# Patient Record
Sex: Male | Born: 1945 | Race: White | Hispanic: No | Marital: Married | Smoking: Never smoker
Health system: Southern US, Community
[De-identification: ages and names within clinical notes are randomized; demographics above are authoritative.]

## PROBLEM LIST (undated history)

## (undated) DIAGNOSIS — Z789 Other specified health status: Secondary | ICD-10-CM

## (undated) HISTORY — PX: SHOULDER ARTHROSCOPY: SHX128

---

## 2008-06-19 ENCOUNTER — Ambulatory Visit (HOSPITAL_BASED_OUTPATIENT_CLINIC_OR_DEPARTMENT_OTHER): Admission: RE | Admit: 2008-06-19 | Discharge: 2008-06-19 | Payer: Self-pay | Admitting: Orthopedic Surgery

## 2010-12-01 NOTE — Op Note (Signed)
NAME:  Ian Gilmore, Ian Gilmore              ACCOUNT NO.:  [id number]   MEDICAL RECORD NO.:  [id number]          PATIENT TYPE:  AMB   LOCATION:  DSC                          FACILITY:  [facility]   PHYSICIAN:  Feliberto Gottron. Turner Daniels, M.D.   DATE OF BIRTH:  [birth year]   DATE OF PROCEDURE:  06/19/2008  DATE OF DISCHARGE:                               OPERATIVE REPORT   PREOPERATIVE DIAGNOSES:  Right shoulder impingement syndrome,  acromioclavicular joint arthritis, and possible rotator cuff tear.   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   PROCEDURE:  Right shoulder arthroscopic anterior-inferior acromioplasty;  formal distal clavicle excision; debridement of chondromalacia from the  humeral head, grade 3 to grade 4 over a 2.5 cm area; and debridement of  degenerative tearing of the superior-anterior labrum.   SURGEON:  Feliberto Gottron. Turner Daniels, MD   FIRST ASSISTANT:  Shirl Harris, PA-C   ANESTHESIA:  General endotracheal plus right interscalene block.   ESTIMATED BLOOD LOSS:  Minimal.   FLUID REPLACEMENT:  1200 mL of crystalloid.   DRAINS PLACED:  None.   TOURNIQUET TIME:  None.   INDICATIONS FOR PROCEDURE:  A 65 year old man with fairly impressive  subacromial spurring, AC joint arthritis with some gas in the [facility] joint  as well as symptoms consistent with impingement, possible labral tear  with some catching and popping in the shoulder.  Another consideration  would be a rotator cuff tear.  In any event, he has failed conservative  treatment, anti-inflammatory medicines, exercises, physical therapy, and  only gets temporary relief with cortisone injections.  He desires  elective arthroscopic evaluation and treatment of his shoulder.  The  risks and benefits of surgery discussed, questions answered.   DESCRIPTION OF PROCEDURE:  The patient was  identified by armband and  underwent right interscalene block anesthetic at [facility].  He was then taken to operating room 6, appropriate anesthetic  monitors were attached and general endotracheal anesthesia induced with  the patient in supine position.  He received preoperative IV  antibiotics.  He was then placed in the beach chair position, and the  right upper extremity prepped and draped in the usual sterile fashion  from the wrist to the hemithorax.  A time-out procedure was then  performed and using a #11 blade, standard portals were made at 1.5 cm  anterior to the [facility] joint, lateral to the junction, middle and posterior  surface of the acromion, and posterior to the posterolateral corner of  the acromion process.  Gravity inflow was placed anteriorly, the  arthroscope laterally, and a 4.2 great white sucker shaver posteriorly  allowing subacromial bursectomy and identification of a fairly large,  type 2 to type 3 subacromial spur of about 14-mm in depth, and this was  then removed with a 4.5-hooded vortex bur taking pretty much 3 full  thickness sweeps with the bur.  This revealed the arthritic AC joint  which was denuded of cartilage and the inferior distal 1 cm  of the  clavicle was then removed with the same bur.  This gave [place] good  visualization of the external leaflets of the rotator cuff, which were  intact.  We then brought the inflow in laterally, the arthroscope  posteriorly, and the bur in anteriorly, completing the distal clavicle  excision and photographic documentation was made of this.  The  arthroscope was then reinserted into the glenohumeral joint using the  posterior portal, where we documented some tearing of the superior-  anterior labrum, this was debrided back to a stable margin of 3.5 Gator  sucker shaver and more importantly, we found large flap tears off of the  central area of the humeral head that were debrided back to a stable  margin  with a 3.5 Gator sucker shaver and revealing grade 3 to grade 4  chondromalacia over a 2.5 cm diameter area of the humeral head.  We  evaluated the inferior patches to make sure there were no loose bodies  and at this point, the shoulder irrigated out with normal saline  solution.  The arthroscopic instruments were removed and a dressing of  Xeroform, 4x4 dressing, sponges, paper tape, and a sling applied.  The  patient laid supine, awakened, and taken to the recovery room without  difficulty.      Feliberto Gottron. Turner Daniels, M.D.  Electronically Signed     FJR/MEDQ  D:  06/19/2008  T:  06/20/2008  Job:  [id number]

## 2011-04-23 LAB — POCT HEMOGLOBIN-HEMACUE: Hemoglobin: 14.4 g/dL (ref 13.0–17.0)

## 2013-05-14 ENCOUNTER — Ambulatory Visit
Admission: RE | Admit: 2013-05-14 | Discharge: 2013-05-14 | Disposition: A | Discharge status: Home / Self Care | Payer: Medicare HMO | Source: Ambulatory Visit | Attending: Internal Medicine | Admitting: Internal Medicine

## 2013-05-14 ENCOUNTER — Other Ambulatory Visit: Payer: Self-pay | Admitting: Internal Medicine

## 2013-05-14 DIAGNOSIS — M545 Low back pain, unspecified: Secondary | ICD-10-CM

## 2015-06-16 ENCOUNTER — Ambulatory Visit: Payer: PPO | Attending: Geriatric Medicine | Admitting: Physical Therapy

## 2015-06-16 DIAGNOSIS — Y9302 Activity, running: Secondary | ICD-10-CM | POA: Diagnosis not present

## 2015-06-16 DIAGNOSIS — Y9301 Activity, walking, marching and hiking: Secondary | ICD-10-CM | POA: Insufficient documentation

## 2015-06-16 DIAGNOSIS — M546 Pain in thoracic spine: Secondary | ICD-10-CM | POA: Diagnosis not present

## 2015-06-16 DIAGNOSIS — M2560 Stiffness of unspecified joint, not elsewhere classified: Secondary | ICD-10-CM | POA: Diagnosis present

## 2015-06-16 DIAGNOSIS — X58XXXA Exposure to other specified factors, initial encounter: Secondary | ICD-10-CM | POA: Diagnosis not present

## 2015-06-16 NOTE — Therapy (Signed)
[place], [place], [address] Phone: [phone]   Fax:  [phone]  Physical Therapy Evaluation  Patient Details  Name: Ian Gilmore MRN: [id number] Date of Birth: 07/26/1945 Referring Provider: Felipa Eth  Encounter Date: 06/16/2015      PT End of Session - 06/16/15 1340    Visit Number 1   Number of Visits 12   Date for PT Re-Evaluation 07/28/15   PT Start Time 1021   PT Stop Time 1103   PT Time Calculation (min) 42 min   Activity Tolerance Patient tolerated treatment well      No past medical history on file.  No past surgical history on file.  There were no vitals filed for this visit.  Visit Diagnosis:  Midline thoracic back pain  Joint stiffness of spine  Activities involving walking and running      Subjective Assessment - 06/16/15 1022    Subjective Pt reports onset back in Fall 2014 when he was doing leg press at the gym.  Since then he has had fairly steady back pain and problems, is usually able to recover and return to the gym.  He has not been able to do so since 02/17/15.  He reports workouts 5-6 days a week for years up until the past year. . Runs, weights, core work.  He denies numbness, weakness or red flags.  Mechanical low back pain but now pain is mid back.  Can do all he needs to do but can feel pain and tightness post activity.      Pertinent History Occ mechanical LBP over the years, revealed towards end of eval it has radiated into hi Rt. leg in the past.  Strained L ant rib recently when lifting.     Limitations Other (comment);Lifting;House hold activities;Walking  Exercise, yardwork    How long can you sit comfortably? Not limited   How long can you stand comfortably? Not limited   How long can you walk comfortably? Gets uncomfortable after "a while" but unable to time.    Diagnostic tests no new XR, in 2014 he had XR which was neg but did have min anterolisthesis  in L spine    Patient Stated Goals return to the gym and lose weight   Currently in Pain? Yes   Pain Score 1   with quick movements can be sudden and severe but resolves   Pain Location Back   Pain Orientation Mid   Pain Descriptors / Indicators Sharp   Pain Type Chronic pain   Pain Onset More than a month ago   Pain Frequency Intermittent   Aggravating Factors  trunk rotation, quick movements laterally and rotating   Pain Relieving Factors bilateral knees to chest sometimes help, uses heat alot in recliner    Effect of Pain on Daily Activities has to be cautious with more strenuous activities.             [facility] PT Assessment - 06/16/15 1037    Assessment   Medical Diagnosis back pain    Referring Provider [place]   Onset Date/Surgical Date 02/17/15  chronic   Prior Therapy no   Precautions   Precautions None   Restrictions   Weight Bearing Restrictions No   Balance Screen   Has the patient fallen in the past 6 months No   [place] residence   Prior Function   Level of Independence Independent   Cognition  Overall Cognitive Status Within Functional Limits for tasks assessed   Observation/Other Assessments   Focus on Therapeutic Outcomes (FOTO)  43%   Sensation   Light Touch Appears Intact   Coordination   Gross Motor Movements are Fluid and Coordinated Not tested   Posture/Postural Control   Posture/Postural Control No significant limitations   AROM   Right/Left Shoulder --  WNL but pain mid back end range overhead   Cervical Flexion WNL stretch   Cervical - Right Side Bend WNL min pain   Cervical - Left Side Bend WNL min pain    Cervical - Right Rotation WNL pain    Cervical - Left Rotation WNL pain    Lumbar Flexion WNL    Lumbar Extension WNL min pain    Lumbar - Right Rotation pain and 25% limited    Lumbar - Left Rotation min pain and 10-15% limited    Strength   Strength Assessment Site --  UE strength in  flexion and abd WNL    Palpation   Spinal mobility hypomobile in Thoracic spine, pain T7-T10   Palpation comment mid thoracic rotated L                    [facility] Adult PT Treatment/Exercise - 06/16/15 1037    Lumbar Exercises: Sidelying   Other Sidelying Lumbar Exercises upper trunk rotation (book openings) x 5 each side   Lumbar Exercises: Quadruped   Madcat/Old Horse 5 reps   Other Quadruped Lumbar Exercises child's pose    Manual Therapy   Manual Therapy Joint mobilization;Soft tissue mobilization;Myofascial release   Joint Mobilization T7-T10 Unilateral Gr. 1-2 on L side   Soft tissue mobilization paraspinals and lower trap L    Myofascial Release mid back                 PT Education - 06/16/15 1339    Education provided Yes   Education Details differential diagnosis, HEP, PT/POC   Person(s) Educated Patient   Methods Explanation;Demonstration;Verbal cues;Handout   Comprehension Verbalized understanding;Returned demonstration          PT Short Term Goals - 06/16/15 1349    PT SHORT TERM GOAL #1   Title Pt will be able to to walk normal pace for 30 min and report no lasting increase in pain    Time 3   Period Weeks   Status New   PT SHORT TERM GOAL #2   Title Pt will be I with basic HEP   Time 3   Period Weeks   Status New   PT SHORT TERM GOAL #3   Title Pt will be able to perform overhead motions with UE and rotation with no pain in mid back.    Time 3   Period Weeks   Status New           PT Long Term Goals - 06/16/15 1351    PT LONG TERM GOAL #1   Title Pt will be I with more advanced HEP and use of proper lifting techniques to help get him back into the gym.    Time 6   Period Weeks   Status New   PT LONG TERM GOAL #2   Title Pt will score 40% limited or less on FOTO to demo improvement in function, mobility.    Time 6   Period Weeks   Status New   PT LONG TERM GOAL #3   Title Pt will return to modified lifting  and cardio  routine without lasting pain in back.    Time 6   Period Weeks   Status New   PT LONG TERM GOAL #4   Title Pt will be able to sleep with min disturbance of pain most days of the week   Time 6   Period Weeks   Status New               Plan - 07/04/15 1344    Clinical Impression Statement Patient with longstanding pain in mid back which radiates into bilateral ribs wrapping anteriorly.  This limits his ability to perform quicker movements or routine activities without pain.  He has not worked out due to fear of further injury since Aug and is eager to do so.  Symptoms are consistent with unilateral costovertebral rotation vs muscle strain.  Given hypomobility and patten of radiaion and persistent pain, I feel the spine is involved.     Pt will benefit from skilled therapeutic intervention in order to improve on the following deficits Pain;Hypomobility;Decreased mobility;Impaired flexibility;Increased fascial restricitons   Rehab Potential Excellent   PT Frequency 2x / week   PT Duration 6 weeks   PT Treatment/Interventions Ultrasound;Neuromuscular re-education;Passive range of motion;Electrical Stimulation;Cryotherapy;Functional mobility training;Therapeutic activities;Therapeutic exercise;Moist Heat;Manual techniques;Dry needling   PT Next Visit Plan Manual and modalities, check HEP and move to safe core (plank) consider MMT of LE   PT Home Exercise Plan quadruped and upper trunk rot.    Consulted and Agree with Plan of Care Patient          G-Codes - 07/04/2015 1315    Functional Assessment Tool Used FOTO   Functional Limitation Mobility: Walking and moving around   Mobility: Walking and Moving Around Current Status [phone]) At least 40 percent but less than 60 percent impaired, limited or restricted   Mobility: Walking and Moving Around Goal Status [phone]) At least 20 percent but less than 40 percent impaired, limited or restricted       Problem List There are no active  problems to display for this patient.   Nancyjo Givhan Jul 04, 2015, 1:57 PM  [place], [place], [address] Phone: [phone]   Fax:  [phone]  Name: Ian Gilmore MRN: [id number] Date of Birth: Dec 08, 1945   Raeford Razor, PT 07-04-15 1:57 PM Phone: [phone] Fax: [phone]

## 2015-06-16 NOTE — Patient Instructions (Signed)
  [email or web address]   Copyright  VHI. All rights reserved.

## 2015-06-30 ENCOUNTER — Ambulatory Visit: Payer: PPO | Attending: Geriatric Medicine | Admitting: Physical Therapy

## 2015-06-30 DIAGNOSIS — Y9301 Activity, walking, marching and hiking: Secondary | ICD-10-CM | POA: Diagnosis not present

## 2015-06-30 DIAGNOSIS — M2560 Stiffness of unspecified joint, not elsewhere classified: Secondary | ICD-10-CM

## 2015-06-30 DIAGNOSIS — Y9302 Activity, running: Secondary | ICD-10-CM | POA: Insufficient documentation

## 2015-06-30 DIAGNOSIS — M546 Pain in thoracic spine: Secondary | ICD-10-CM | POA: Diagnosis not present

## 2015-06-30 NOTE — Patient Instructions (Signed)
Gave bird Education officer, environmental, info on [employer] dry needling [employer] info, see self care

## 2015-06-30 NOTE — Therapy (Signed)
[place], [place], [address] Phone: [phone]   Fax:  [phone]  Physical Therapy Treatment  Patient Details  Name: Ian Gilmore MRN: [id number] Date of Birth: 05/25/1946 Referring Provider: Felipa Eth  Encounter Date: 06/30/2015      PT End of Session - 06/30/15 1511    Visit Number 2   Number of Visits 12   Date for PT Re-Evaluation 07/28/15   PT Start Time 4076   PT Stop Time 1525   PT Time Calculation (min) 70 min   Activity Tolerance Patient tolerated treatment well   Behavior During Therapy [facility] for tasks assessed/performed      No past medical history on file.  No past surgical history on file.  There were no vitals filed for this visit.  Visit Diagnosis:  Midline thoracic back pain  Joint stiffness of spine  Activities involving walking and running      Subjective Assessment - 06/30/15 1421    Subjective Pt with pain when he is more physical, discomfort in mid back.  Never goes away.    Currently in Pain? Yes   Pain Score 2    Pain Location Back   Pain Orientation Mid   Pain Descriptors / Indicators Tender;Sharp   Pain Type Chronic pain   Pain Onset More than a month ago   Pain Frequency Constant   Aggravating Factors  trunk rot, raking   Pain Relieving Factors stretching   Multiple Pain Sites No            [facility] PT Assessment - 06/30/15 1437    Strength   Right Hip Extension 5/5   Right Hip ABduction 4+/5   Left Hip Extension 5/5   Left Hip ABduction 4+/5   Palpation   Spinal mobility hypomobile in Thoracic spine, pain T7-T10   Palpation comment mid thoracic rotated L                      [facility] Adult PT Treatment/Exercise - 06/30/15 1434    Self-Care   Self-Care Posture;Other Self-Care Comments   Posture sitting posture, use of abdominals during weights   Other Self-Care Comments  safety: rec no running and "swinging the bat" until he has  done 4 consecutive visits.  Advised to stick with bike/walking and HEP, UE weights ok   Lumbar Exercises: Sidelying   Other Sidelying Lumbar Exercises upper trunk rotation (book openings) x 5 each side  more pain to L    Lumbar Exercises: Prone   Opposite Arm/Leg Raise Right arm/Left leg;Left arm/Right leg;10 reps   Lumbar Exercises: Quadruped   Madcat/Old Horse 10 reps   Plank modified in quadruped, able to do full elbow plank legs extended with min strain in low back    Other Quadruped Lumbar Exercises child's pose    Moist Heat Therapy   Number Minutes Moist Heat 15 Minutes   Moist Heat Location Other (comment)  thoracic   Manual Therapy   Manual Therapy Joint mobilization;Soft tissue mobilization;Myofascial release   Joint Mobilization T7-T10 Unilateral Gr. 1-2 on L side  P/A gr 1-2    Soft tissue mobilization paraspinals and lower trap L    Myofascial Release mid back                 PT Education - 06/30/15 1511    Education provided Yes   Education Details POC, core ex, safety, Dry needling    Person(s) Educated Patient  Methods Explanation;Demonstration;Handout   Comprehension Verbalized understanding;Returned demonstration          PT Short Term Goals - 06/16/15 1349    PT SHORT TERM GOAL #1   Title Pt will be able to to walk normal pace for 30 min and report no lasting increase in pain    Time 3   Period Weeks   Status New   PT SHORT TERM GOAL #2   Title Pt will be I with basic HEP   Time 3   Period Weeks   Status New   PT SHORT TERM GOAL #3   Title Pt will be able to perform overhead motions with UE and rotation with no pain in mid back.    Time 3   Period Weeks   Status New           PT Long Term Goals - 06/16/15 1351    PT LONG TERM GOAL #1   Title Pt will be I with more advanced HEP and use of proper lifting techniques to help get him back into the gym.    Time 6   Period Weeks   Status New   PT LONG TERM GOAL #2   Title Pt will  score 40% limited or less on FOTO to demo improvement in function, mobility.    Time 6   Period Weeks   Status New   PT LONG TERM GOAL #3   Title Pt will return to modified lifting and cardio routine without lasting pain in back.    Time 6   Period Weeks   Status New   PT LONG TERM GOAL #4   Title Pt will be able to sleep with min disturbance of pain most days of the week   Time 6   Period Weeks   Status New               Plan - 06/30/15 1512    Clinical Impression Statement 2nd visit, no goals met. Pt with multiple questions and concerns about returning to full function, how to improve physical fitness and not exacerbate pain.  No pain post session but did "feel something" in mid back as he walked out.    PT Next Visit Plan Manual and modalities, lower ab progression cont to answer questions re: technique and neutral spine   PT Home Exercise Plan quadruped and upper trunk rot. added bird dog   Consulted and Agree with Plan of Care Patient        Problem List There are no active problems to display for this patient.   PAA,JENNIFER 06/30/2015, 3:28 PM  [facility] [address] [place], [place], [address] Phone: [phone]   Fax:  [phone]  Name: HOLDAN STUCKE MRN: [id number] Date of Birth: [birth year]    Raeford Razor, PT 06/30/2015 3:29 PM Phone: [phone] Fax: [phone]

## 2015-07-03 ENCOUNTER — Ambulatory Visit: Payer: PPO | Admitting: Physical Therapy

## 2015-07-03 DIAGNOSIS — Y9302 Activity, running: Secondary | ICD-10-CM

## 2015-07-03 DIAGNOSIS — Y9301 Activity, walking, marching and hiking: Secondary | ICD-10-CM

## 2015-07-03 DIAGNOSIS — M546 Pain in thoracic spine: Secondary | ICD-10-CM | POA: Diagnosis not present

## 2015-07-03 DIAGNOSIS — M2560 Stiffness of unspecified joint, not elsewhere classified: Secondary | ICD-10-CM

## 2015-07-03 NOTE — Therapy (Signed)
[place] [place], [place], [address] Phone: [phone]   Fax:  [phone]  Physical Therapy Treatment  Patient Details  Name: Ian Gilmore MRN: [id number] Date of Birth: 12-Feb-1946 Referring Provider: Felipa Eth  Encounter Date: 07/03/2015      PT End of Session - 07/03/15 1548    Visit Number 3   Number of Visits 12   Date for PT Re-Evaluation 07/28/15   PT Start Time 1500   PT Stop Time 1553   PT Time Calculation (min) 53 min   Activity Tolerance Patient tolerated treatment well   Behavior During Therapy [facility] for tasks assessed/performed      No past medical history on file.  No past surgical history on file.  There were no vitals filed for this visit.  Visit Diagnosis:  Midline thoracic back pain  Joint stiffness of spine  Activities involving walking and running      Subjective Assessment - 07/03/15 1501    Subjective No pain now.  Raking a little yesterday.  It was horrible this AM.     Currently in Pain? No/denies             [facility] Adult PT Treatment/Exercise - 07/03/15 1511    Lumbar Exercises: Stretches   Standing Side Bend 2 reps;30 seconds   Standing Side Bend Limitations seated over bolster for thoracic    Lumbar Exercises: Standing   Other Standing Lumbar Exercises isometric post chain strengthening   Other Standing Lumbar Exercises corner stretch    Lumbar Exercises: Supine   Ab Set 10 reps   Clam 10 reps;Other (comment)   Clam Limitations 3 sets used props and mod cueing   Bent Knee Raise 20 reps   Bent Knee Raise Limitations ball under pelvis   Bridge 10 reps   Straight Leg Raise 10 reps   Straight Leg Raises Limitations ball   added opp LE for core challenge    Other Supine Lumbar Exercises TrA x 10 with  ball squeeze   Other Supine Lumbar Exercises lower ab progression from table top with modifcations         MHP 15 min prone             PT Short  Term Goals - 06/16/15 1349    PT SHORT TERM GOAL #1   Title Pt will be able to to walk normal pace for 30 min and report no lasting increase in pain    Time 3   Period Weeks   Status New   PT SHORT TERM GOAL #2   Title Pt will be I with basic HEP   Time 3   Period Weeks   Status New   PT SHORT TERM GOAL #3   Title Pt will be able to perform overhead motions with UE and rotation with no pain in mid back.    Time 3   Period Weeks   Status New           PT Long Term Goals - 06/16/15 1351    PT LONG TERM GOAL #1   Title Pt will be I with more advanced HEP and use of proper lifting techniques to help get him back into the gym.    Time 6   Period Weeks   Status New   PT LONG TERM GOAL #2   Title Pt will score 40% limited or less on FOTO to demo improvement in function, mobility.    Time  6   Period Weeks   Status New   PT LONG TERM GOAL #3   Title Pt will return to modified lifting and cardio routine without lasting pain in back.    Time 6   Period Weeks   Status New   PT LONG TERM GOAL #4   Title Pt will be able to sleep with min disturbance of pain most days of the week   Time 6   Period Weeks   Status New               Plan - 07/03/15 1632    Clinical Impression Statement Worked on low ab progression and posture in standing. Understands relation of shoulder tightness to postural imbalance and back pain.  Pt needs cues to move slowly and safely to decrease risk of reinjury.    PT Next Visit Plan Manual and modalities, lower ab progression cont to answer questions re: technique and neutral spine   PT Home Exercise Plan quadruped and upper trunk rot. added bird dog   Consulted and Agree with Plan of Care Patient        Problem List There are no active problems to display for this patient.   PAA,JENNIFER 07/03/2015, 4:34 PM  [place], [place], [address] Phone: [phone]   Fax:   [phone]  Name: GORGE KEHR MRN: [id number] Date of Birth: 1946/05/05    Raeford Razor, PT 07/03/2015 4:34 PM Phone: [phone] Fax: [phone]

## 2015-07-07 ENCOUNTER — Ambulatory Visit: Payer: PPO | Admitting: Physical Therapy

## 2015-07-07 DIAGNOSIS — M546 Pain in thoracic spine: Secondary | ICD-10-CM

## 2015-07-07 DIAGNOSIS — Y9302 Activity, running: Secondary | ICD-10-CM

## 2015-07-07 DIAGNOSIS — M2560 Stiffness of unspecified joint, not elsewhere classified: Secondary | ICD-10-CM

## 2015-07-07 DIAGNOSIS — Y9301 Activity, walking, marching and hiking: Secondary | ICD-10-CM

## 2015-07-07 NOTE — Therapy (Signed)
[place], [place], [address] Phone: [phone]   Fax:  [phone]  Physical Therapy Treatment  Patient Details  Name: Ian Gilmore MRN: [id number] Date of Birth: [birth year] Referring Provider: Felipa Eth  Encounter Date: 07/07/2015      PT End of Session - 07/07/15 1025    Visit Number 4   Number of Visits 12   Date for PT Re-Evaluation 07/28/15   PT Start Time 1022   PT Stop Time 1115   PT Time Calculation (min) 53 min   Activity Tolerance Patient tolerated treatment well   Behavior During Therapy [facility] for tasks assessed/performed      No past medical history on file.  No past surgical history on file.  There were no vitals filed for this visit.  Visit Diagnosis:  Midline thoracic back pain  Joint stiffness of spine  Activities involving walking and running      Subjective Assessment - 07/07/15 1024    Subjective No pain until he grabbed handle on NuStep and had sharp pain in Rt. side.  Resolves with more thoughtful movement.    Currently in Pain? No/denies             [facility] Adult PT Treatment/Exercise - 07/07/15 1036    Lumbar Exercises: Stretches   Active Hamstring Stretch 2 reps;30 seconds   Single Knee to Chest Stretch 2 reps;30 seconds   Lower Trunk Rotation 4 reps;10 seconds   Lower Trunk Rotation Limitations with knee to chest (gapping)    Quadruped Mid Back Stretch 5 reps   Quadruped Mid Back Stretch Limitations done in standing at sink    Lumbar Exercises: Aerobic   Stationary Bike NuStep L8, fast pace 5 min    Lumbar Exercises: Standing   Other Standing Lumbar Exercises hip hinging  x 5 each with a UE pole    Lumbar Exercises: Supine   Clam 20 reps   Clam Limitations on foam roller    Bent Knee Raise 20 reps   Bent Knee Raise Limitations added UE for dead bug    Straight Leg Raise 10 reps   Other Supine Lumbar Exercises Horiz abd with and without Blue band x  10   on foam roller   Other Supine Lumbar Exercises altenrating flexion/ext    Moist Heat Therapy   Number Minutes Moist Heat 15 Minutes   Moist Heat Location Lumbar Spine  and thoracic                 PT Education - 07/07/15 1200    Education provided Yes   Education Details hip hinging, dry needling   Person(s) Educated Patient   Methods Explanation   Comprehension Verbalized understanding;Returned demonstration          PT Short Term Goals - 07/07/15 1202    PT SHORT TERM GOAL #1   Title Pt will be able to to walk normal pace for 30 min and report no lasting increase in pain    Status On-going   PT SHORT TERM GOAL #2   Title Pt will be I with basic HEP   Status On-going   PT SHORT TERM GOAL #3   Title Pt will be able to perform overhead motions with UE and rotation with no pain in mid back.    Status On-going           PT Long Term Goals - 07/07/15 1203    PT LONG TERM GOAL #1  Title Pt will be I with more advanced HEP and use of proper lifting techniques to help get him back into the gym.    Status On-going   PT LONG TERM GOAL #2   Title Pt will score 40% limited or less on FOTO to demo improvement in function, mobility.    Status On-going   PT LONG TERM GOAL #3   Title Pt will return to modified lifting and cardio routine without lasting pain in back.    Status On-going   PT LONG TERM GOAL #4   Title Pt will be able to sleep with min disturbance of pain most days of the week   Status On-going               Plan - 07/07/15 1201    Clinical Impression Statement Pt able to do foam roller spinal stab exercises with good technique, min cueing.  He is learning how to move with good mechanics, still needs reinforcement on how to safely stretch.     PT Next Visit Plan Reveiw home routine and give copies of hamstring stretch, glutes. Manual and modalities, lower ab progression cont to answer questions re: technique and neutral spine   PT Home  Exercise Plan quadruped and upper trunk rot. added bird dog   Consulted and Agree with Plan of Care Patient     On waiting list for next week.    Problem List There are no active problems to display for this patient.   PAA,JENNIFER 07/07/2015, 12:04 PM  [place], [place], [address] Phone: [phone]   Fax:  [phone]  Name: Ian Gilmore MRN: [id number] Date of Birth: 05/20/1946    Raeford Razor, PT 07/07/2015 12:04 PM Phone: [phone] Fax: [phone]

## 2015-07-10 ENCOUNTER — Ambulatory Visit: Payer: PPO | Admitting: Physical Therapy

## 2015-07-10 DIAGNOSIS — Y9301 Activity, walking, marching and hiking: Secondary | ICD-10-CM

## 2015-07-10 DIAGNOSIS — M546 Pain in thoracic spine: Secondary | ICD-10-CM

## 2015-07-10 DIAGNOSIS — Y9302 Activity, running: Secondary | ICD-10-CM

## 2015-07-10 DIAGNOSIS — M2560 Stiffness of unspecified joint, not elsewhere classified: Secondary | ICD-10-CM

## 2015-07-10 NOTE — Therapy (Signed)
[place], [place], [address] Phone: [phone]   Fax:  [phone]  Physical Therapy Treatment  Patient Details  Name: Ian Gilmore MRN: [id number] Date of Birth: Jun 04, 1946 Referring Provider: Felipa Eth  Encounter Date: 07/10/2015      PT End of Session - 07/10/15 1246    Visit Number 5   Number of Visits 12   Date for PT Re-Evaluation 07/28/15   PT Start Time 1204   PT Stop Time 1240   PT Time Calculation (min) 36 min   Activity Tolerance Patient tolerated treatment well   Behavior During Therapy [facility] for tasks assessed/performed      No past medical history on file.  No past surgical history on file.  There were no vitals filed for this visit.  Visit Diagnosis:  Midline thoracic back pain  Joint stiffness of spine  Activities involving walking and running      Subjective Assessment - 07/10/15 1205    Subjective A little sore, [facility] a [facility] the [facility] 2 [facility])   Currently in Pain? No/denies                   ElliptIcal level 10/10 for 5 min cues to use caution and use moderation.       [facility] Adult PT Treatment/Exercise - 07/10/15 1206    Lumbar Exercises: Standing   Wall Slides 15 reps   Other Standing Lumbar Exercises horiz abd green band x 15 in mini squat    Lumbar Exercises: Quadruped   Madcat/Old Horse 5 reps   Single Arm Raise 5 reps   Straight Leg Raise 5 reps   Opposite Arm/Leg Raise 10 reps   Plank modified in quadruped position    Other Quadruped Lumbar Exercises childs pose      Seated self mob with roller for thoracic extension x 10 small ROM, discomfort and pain as he returns to flexion.   Ardine Eng pose with arm across body for mid back rotation 30 sec each    Self care: Gym, safety, caution Avoid extension beyond neutral, avoid weighted flexion and use caution with lower trunk rotation Pt adamant  that he is using good form.        PT Education - 07/10/15 1245    Education provided Yes   Education Details safety at the gym, thoracic ext over roller and dry needling   Person(s) Educated Patient   Methods Explanation;Demonstration   Comprehension Verbalized understanding;Returned demonstration          PT Short Term Goals - 07/07/15 1202    PT SHORT TERM GOAL #1   Title Pt will be able to to walk normal pace for 30 min and report no lasting increase in pain    Status On-going   PT SHORT TERM GOAL #2   Title Pt will be I with basic HEP   Status On-going   PT SHORT TERM GOAL #3   Title Pt will be able to perform overhead motions with UE and rotation with no pain in mid back.    Status On-going           PT Long Term Goals - 07/07/15 1203    PT LONG TERM GOAL #1   Title Pt will be I with more advanced HEP and use of proper lifting techniques to help get him back into the gym.    Status On-going   PT  LONG TERM GOAL #2   Title Pt will score 40% limited or less on FOTO to demo improvement in function, mobility.    Status On-going   PT LONG TERM GOAL #3   Title Pt will return to modified lifting and cardio routine without lasting pain in back.    Status On-going   PT LONG TERM GOAL #4   Title Pt will be able to sleep with min disturbance of pain most days of the week   Status On-going               Plan - 07/10/15 1247    Clinical Impression Statement Patient insists he is careful at the gym, reports doing back and core ex with light or no weights and paying attn to spinal elongation, posture and alignment.  Still has same level of pain along lower thoracic spine.  Pain with sudden movements and any amt of jogging.     PT Next Visit Plan thoracic/rib mobilization and consider dry needling   PT Home Exercise Plan quadruped and upper trunk rot. added bird dog   Consulted and Agree with Plan of Care Patient        Problem List There are no active problems  to display for this patient.   Maiah Sinning 07/10/2015, 12:50 PM  [facility] [address] [place], [place], [address] Phone: [phone]   Fax:  [phone]  Name: RISHAV MAYE MRN: [id number] Date of Birth: 1946-06-11    Raeford Razor, PT 07/10/2015 12:50 PM Phone: [phone] Fax: [phone]

## 2015-07-17 ENCOUNTER — Ambulatory Visit: Payer: PPO | Admitting: Physical Therapy

## 2015-07-17 DIAGNOSIS — Y9302 Activity, running: Secondary | ICD-10-CM

## 2015-07-17 DIAGNOSIS — M546 Pain in thoracic spine: Secondary | ICD-10-CM | POA: Diagnosis not present

## 2015-07-17 DIAGNOSIS — M2560 Stiffness of unspecified joint, not elsewhere classified: Secondary | ICD-10-CM

## 2015-07-17 DIAGNOSIS — Y9301 Activity, walking, marching and hiking: Secondary | ICD-10-CM

## 2015-07-17 NOTE — Patient Instructions (Signed)
Trigger Point Dry Needling  . What is Trigger Point Dry Needling (DN)? o DN is a physical therapy technique used to treat muscle pain and dysfunction. Specifically, DN helps deactivate muscle trigger points (muscle knots).  o A thin filiform needle is used to penetrate the skin and stimulate the underlying trigger point. The goal is for a local twitch response (LTR) to occur and for the trigger point to relax. No medication of any kind is injected during the procedure.   . What Does Trigger Point Dry Needling Feel Like?  o The procedure feels different for each individual patient. Some patients report that they do not actually feel the needle enter the skin and overall the process is not painful. Very mild bleeding may occur. However, many patients feel a deep cramping in the muscle in which the needle was inserted. This is the local twitch response.   Marland Kitchen How Will I feel after the treatment? o Soreness is normal, and the onset of soreness may not occur for a few hours. Typically this soreness does not last longer than two days.  o Bruising is uncommon, however; ice can be used to decrease any possible bruising.  o In rare cases feeling tired or nauseous after the treatment is normal. In addition, your symptoms may get worse before they get better, this period will typically not last longer than 24 hours.   . What Can I do After My Treatment? o Increase your hydration by drinking more water for the next 24 hours. o You may place ice or heat on the areas treated that have become sore, however, do not use heat on inflamed or bruised areas. Heat often brings more relief post needling. o You can continue your regular activities, but vigorous activity is not recommended initially after the treatment for 24 hours. o DN is best combined with other physical therapy such as strengthening, stretching, and other therapies.   Voncille Lo, PT 07/17/2015 9:47 AM Phone: [phone] Fax: [phone]

## 2015-07-17 NOTE — Therapy (Signed)
[place], [place], [address] Phone: [phone]   Fax:  [phone]  Physical Therapy Treatment  Patient Details  Name: Ian Gilmore MRN: [id number] Date of Birth: 06-Oct-1945 Referring Provider: Felipa Eth  Encounter Date: 07/17/2015      PT End of Session - 07/17/15 1544    Visit Number 6   Number of Visits 12   Date for PT Re-Evaluation 07/28/15   PT Start Time 0932   PT Stop Time 1028   PT Time Calculation (min) 56 min   Activity Tolerance Patient tolerated treatment well   Behavior During Therapy [facility] for tasks assessed/performed      No past medical history on file.  No past surgical history on file.  There were no vitals filed for this visit.  Visit Diagnosis:  Midline thoracic back pain  Joint stiffness of spine  Activities involving walking and running      Subjective Assessment - 07/17/15 0934    Subjective My pain is negligible during the day but it really hurts at night and I am really stiff in the mornings.  At night I get more uncomfortable.  If I do strenuous activity like running.   Pertinent History Occ mechanical LBP over the years, revealed towards end of eval it has radiated into hi Rt. leg in the past.  Strained L ant rib recently when lifting.     How long can you sit comfortably? Not limited   How long can you stand comfortably? Not limited   Diagnostic tests no new XR, in 2014 he had XR which was neg but did have min anterolisthesis in L spine    Patient Stated Goals return to the gym and lose weight   Pain Score 1    Pain Location Back  T 10   Pain Orientation Mid   Pain Descriptors / Indicators Tender;Sore   Pain Type Chronic pain   Pain Onset More than a month ago                         [facility] Adult PT Treatment/Exercise - 07/17/15 1010    Lumbar Exercises: Standing   Other Standing Lumbar Exercises trunk rotation bil 5 x  5 sec hold   Lumbar Exercises: Quadruped   Madcat/Old Horse 5 reps   Single Arm Raise 5 reps   Straight Leg Raise 5 reps   Moist Heat Therapy   Number Minutes Moist Heat 15 Minutes   Moist Heat Location Lumbar Spine  thoracic   Manual Therapy   Manual Therapy Joint mobilization;Soft tissue mobilization;Myofascial release   Joint Mobilization T7-T10 Grade 3 sp process PA,  UPA same T-7 to T12  left more hypomobile than Right   P/A gr 3 spinous process.  UPA on left    Soft tissue mobilization thoracic paraspinals with IASTYM tool   Myofascial Release mid back / intercostalis mx          Trigger Point Dry Needling - 07/17/15 1528    Consent Given? Yes   Education Handout Provided Yes   Muscles Treated Upper Body Longissimus  iliocostalis with rib blocking on T- 8 to T 10 left> Right   Longissimus Response Twitch response elicited;Palpable increased muscle length  thoracic paraspinals T- 8 to t- 12 bilaterally with twitch              PT Education - 07/17/15 0946    Education provided Yes  Education Details [employer] and aftercare for TDN, explanation of mobilization and benefit of manual.    Person(s) Educated Patient   Methods Explanation;Handout   Comprehension Verbalized understanding;Returned demonstration          PT Short Term Goals - 07/07/15 1202    PT SHORT TERM GOAL #1   Title Pt will be able to to walk normal pace for 30 min and report no lasting increase in pain    Status On-going   PT SHORT TERM GOAL #2   Title Pt will be I with basic HEP   Status On-going   PT SHORT TERM GOAL #3   Title Pt will be able to perform overhead motions with UE and rotation with no pain in mid back.    Status On-going           PT Long Term Goals - 07/07/15 1203    PT LONG TERM GOAL #1   Title Pt will be I with more advanced HEP and use of proper lifting techniques to help get him back into the gym.    Status On-going   PT LONG TERM GOAL #2   Title Pt will score 40%  limited or less on FOTO to demo improvement in function, mobility.    Status On-going   PT LONG TERM GOAL #3   Title Pt will return to modified lifting and cardio routine without lasting pain in back.    Status On-going   PT LONG TERM GOAL #4   Title Pt will be able to sleep with min disturbance of pain most days of the week   Status On-going               Plan - 07/17/15 0943    Clinical Impression Statement Pt with latent trigger point pain with palpation of lower thoracic T-8 to T-12.  left paraspinals more posterior than Right.  Pt consented to TDN and was educated on precautions, aftercare and use of heat afterwards.  Pt was monitored for response throughout treatment   Pt reported pain was not absent but reduced with trunk rotation after 1st treament.  Pt  was educated on importance of movement to end range and use of heat for next 48 hours..  Pt with hypomobility of  lowere thoracic vertebrae   Pt will benefit from skilled therapeutic intervention in order to improve on the following deficits Pain;Hypomobility;Decreased mobility;Impaired flexibility;Increased fascial restricitons   Rehab Potential Excellent   PT Frequency 2x / week   PT Duration 6 weeks   PT Treatment/Interventions Ultrasound;Neuromuscular re-education;Passive range of motion;Electrical Stimulation;Cryotherapy;Functional mobility training;Therapeutic activities;Therapeutic exercise;Moist Heat;Manual techniques;Dry needling   PT Next Visit Plan continue core and pilates with Guido Sander   PT Home Exercise Plan quadruped and upper trunk rot. added bird dog/ treatment post TDN        Problem List There are no active problems to display for this patient.   Voncille Lo, PT 07/17/2015 3:52 PM Phone: [phone] Fax: [place] [facility] [address] [place], [place], [address] Phone: [phone]   Fax:  [phone]  Name: Ian Gilmore MRN:  [id number] Date of Birth: 30-Aug-1945

## 2015-07-22 ENCOUNTER — Ambulatory Visit: Payer: PPO | Attending: Geriatric Medicine | Admitting: Physical Therapy

## 2015-07-22 DIAGNOSIS — Y9301 Activity, walking, marching and hiking: Secondary | ICD-10-CM | POA: Diagnosis not present

## 2015-07-22 DIAGNOSIS — M2560 Stiffness of unspecified joint, not elsewhere classified: Secondary | ICD-10-CM | POA: Insufficient documentation

## 2015-07-22 DIAGNOSIS — M546 Pain in thoracic spine: Secondary | ICD-10-CM | POA: Diagnosis not present

## 2015-07-22 DIAGNOSIS — Y9302 Activity, running: Secondary | ICD-10-CM | POA: Insufficient documentation

## 2015-07-22 NOTE — Therapy (Signed)
[place], [place], [address] Phone: [phone]   Fax:  [phone]  Physical Therapy Treatment/DISCHARGE Patient Details  Name: Ian Gilmore MRN: [id number] Date of Birth: [birth year] Referring Provider: Felipa Eth  Encounter Date: 07/22/2015      PT End of Session - 07/22/15 0934    Visit Number 7   Number of Visits 12   Date for PT Re-Evaluation 07/28/15   PT Start Time 0930   PT Stop Time 1030   PT Time Calculation (min) 60 min   Activity Tolerance Patient tolerated treatment well   Behavior During Therapy [facility] for tasks assessed/performed      No past medical history on file.  No past surgical history on file.  There were no vitals filed for this visit.  Visit Diagnosis:  Midline thoracic back pain  Joint stiffness of spine  Activities involving walking and running      Subjective Assessment - 07/22/15 0933    Subjective [facility], I pay more attn to my mechanics.  The dry needling was fine but I don't see a big difference overall.  I think I can deal with this now.  Requests DC.    Currently in Pain? No/denies  can be quick and sharp with transitions on mat                          [facility] Adult PT Treatment/Exercise - 07/22/15 0937    Self-Care   Other Self-Care Comments  final HEP and principles for gym ex, lifting, plan    Lumbar Exercises: Stretches   Active Hamstring Stretch 2 reps;30 seconds   Lower Trunk Rotation 3 reps;30 seconds   Lower Trunk Rotation Limitations single knee and rotation to gap L spine    Quadruped Mid Back Stretch 5 reps   ITB Stretch 1 rep;30 seconds   Lumbar Exercises: Sidelying   Other Sidelying Lumbar Exercises upper trunk rotation (book openings) x 5 each side  more pain to L    Moist Heat Therapy   Number Minutes Moist Heat 15 Minutes  prone   Moist Heat Location Lumbar Spine  thoracic   Manual Therapy   Manual Therapy  Joint mobilization;Soft tissue mobilization;Myofascial release   Joint Mobilization T7-T10 Grade 3 sp process PA,  UPA same T-7 to T12  left more hypomobile than Right   P/A gr 3 spinous process.  UPA on left    Myofascial Release mid back / intercostalis mx                PT Education - 07/22/15 1311    Education provided Yes   Education Details HEP review and safety principles   Person(s) Educated Patient   Methods Explanation   Comprehension Verbalized understanding;Returned demonstration          PT Short Term Goals - 07/22/15 1020    PT SHORT TERM GOAL #1   Title Pt will be able to to walk normal pace for 30 min and report no lasting increase in pain    Status Achieved   PT SHORT TERM GOAL #2   Title Pt will be I with basic HEP   Status Achieved   PT SHORT TERM GOAL #3   Title Pt will be able to perform overhead motions with UE and rotation with no pain in mid back.            PT Long  Term Goals - August 17, 2015 1022    PT LONG TERM GOAL #1   Title Pt will be I with more advanced HEP and use of proper lifting techniques to help get him back into the gym.    Status Achieved   PT LONG TERM GOAL #2   Title Pt will score 40% limited or less on FOTO to demo improvement in function, mobility.    Baseline same, 57% limited   Status Not Met   PT LONG TERM GOAL #3   Title Pt will return to modified lifting and cardio routine without lasting pain in back.    Status Achieved   PT LONG TERM GOAL #4   Title Pt will be able to sleep with min disturbance of pain most days of the week   Baseline can sometimes be mod   Status Partially Met               Plan - 2015/08/17 1311    Clinical Impression Statement Pt has met LTGs and is ready to cont independently at the [facility], incorporating principles into his routine.  He is lifting about 50% of what he used to, is not running and focused on core with all ex.     PT Next Visit Plan DC   PT Home Exercise Plan quadruped and  upper trunk rot. added bird dog/    Consulted and Agree with Plan of Care Patient          G-Codes - 08-17-15 1315    Functional Assessment Tool Used FOTO   Functional Limitation Mobility: Walking and moving around   Mobility: Walking and Moving Around Current Status [phone]) At least 40 percent but less than 60 percent impaired, limited or restricted   Mobility: Walking and Moving Around Goal Status [phone]) At least 40 percent but less than 60 percent impaired, limited or restricted   Mobility: Walking and Moving Around Discharge Status [phone]) At least 40 percent but less than 60 percent impaired, limited or restricted      Problem List There are no active problems to display for this patient.   PAA,JENNIFER 08-17-15, 1:15 PM  [facility] [address] [place], [place], [address] Phone: [phone]   Fax:  [phone]  Name: Ian Gilmore MRN: [id number] Date of Birth: [birth year]  PHYSICAL THERAPY DISCHARGE SUMMARY  Visits from Start of Care: 7  Current functional level related to goals / functional mobility:  See above     Remaining deficits: Mobility of spine, pain at times, motor control with higher level ex.    Education / EquipmentPension scheme manager, Chief Financial Officer, HEP   Plan: Patient agrees to discharge.  Patient goals were partially met. Patient is being discharged due to being pleased with the current functional level.  ?????    Raeford Razor, PT 2015/08/17 1:17 PM Phone: [phone] Fax: [phone]

## 2015-07-24 ENCOUNTER — Encounter: Payer: Medicare HMO | Admitting: Physical Therapy

## 2015-07-29 ENCOUNTER — Encounter: Payer: Medicare HMO | Admitting: Physical Therapy

## 2015-08-01 ENCOUNTER — Encounter: Payer: Medicare HMO | Admitting: Physical Therapy

## 2016-02-09 DIAGNOSIS — H524 Presbyopia: Secondary | ICD-10-CM | POA: Diagnosis not present

## 2016-04-21 DIAGNOSIS — Z08 Encounter for follow-up examination after completed treatment for malignant neoplasm: Secondary | ICD-10-CM | POA: Diagnosis not present

## 2016-04-21 DIAGNOSIS — L570 Actinic keratosis: Secondary | ICD-10-CM | POA: Diagnosis not present

## 2016-04-21 DIAGNOSIS — Z85828 Personal history of other malignant neoplasm of skin: Secondary | ICD-10-CM | POA: Diagnosis not present

## 2016-06-07 DIAGNOSIS — Z1389 Encounter for screening for other disorder: Secondary | ICD-10-CM | POA: Diagnosis not present

## 2016-06-07 DIAGNOSIS — Z125 Encounter for screening for malignant neoplasm of prostate: Secondary | ICD-10-CM | POA: Diagnosis not present

## 2016-06-07 DIAGNOSIS — G8929 Other chronic pain: Secondary | ICD-10-CM | POA: Diagnosis not present

## 2016-06-07 DIAGNOSIS — Z0000 Encounter for general adult medical examination without abnormal findings: Secondary | ICD-10-CM | POA: Diagnosis not present

## 2016-06-07 DIAGNOSIS — E7800 Pure hypercholesterolemia, unspecified: Secondary | ICD-10-CM | POA: Diagnosis not present

## 2016-06-07 DIAGNOSIS — Z23 Encounter for immunization: Secondary | ICD-10-CM | POA: Diagnosis not present

## 2016-06-07 DIAGNOSIS — M545 Low back pain: Secondary | ICD-10-CM | POA: Diagnosis not present

## 2016-06-07 DIAGNOSIS — Z79899 Other long term (current) drug therapy: Secondary | ICD-10-CM | POA: Diagnosis not present

## 2016-06-07 DIAGNOSIS — D369 Benign neoplasm, unspecified site: Secondary | ICD-10-CM | POA: Diagnosis not present

## 2016-06-07 DIAGNOSIS — N183 Chronic kidney disease, stage 3 (moderate): Secondary | ICD-10-CM | POA: Diagnosis not present

## 2016-07-01 ENCOUNTER — Other Ambulatory Visit: Payer: Self-pay | Admitting: Gastroenterology

## 2016-08-26 ENCOUNTER — Encounter (HOSPITAL_COMMUNITY): Payer: Self-pay | Admitting: *Deleted

## 2016-08-31 ENCOUNTER — Ambulatory Visit (HOSPITAL_COMMUNITY)
Admission: RE | Admit: 2016-08-31 | Discharge: 2016-08-31 | Disposition: A | Discharge status: Home / Self Care | Payer: PPO | Source: Ambulatory Visit | Attending: Gastroenterology | Admitting: Gastroenterology

## 2016-08-31 ENCOUNTER — Ambulatory Visit (HOSPITAL_COMMUNITY): Payer: PPO | Admitting: Certified Registered Nurse Anesthetist

## 2016-08-31 ENCOUNTER — Encounter (HOSPITAL_COMMUNITY): Payer: Self-pay | Admitting: *Deleted

## 2016-08-31 ENCOUNTER — Encounter (HOSPITAL_COMMUNITY)
Admission: RE | Disposition: A | Discharge status: Home / Self Care | Payer: Self-pay | Source: Ambulatory Visit | Attending: Gastroenterology

## 2016-08-31 DIAGNOSIS — E7800 Pure hypercholesterolemia, unspecified: Secondary | ICD-10-CM | POA: Diagnosis not present

## 2016-08-31 DIAGNOSIS — Z880 Allergy status to penicillin: Secondary | ICD-10-CM | POA: Diagnosis not present

## 2016-08-31 DIAGNOSIS — Z86010 Personal history of colonic polyps: Secondary | ICD-10-CM | POA: Diagnosis not present

## 2016-08-31 DIAGNOSIS — Z1211 Encounter for screening for malignant neoplasm of colon: Secondary | ICD-10-CM | POA: Insufficient documentation

## 2016-08-31 HISTORY — PX: COLONOSCOPY WITH PROPOFOL: SHX5780

## 2016-08-31 HISTORY — DX: Other specified health status: Z78.9

## 2016-08-31 SURGERY — COLONOSCOPY WITH PROPOFOL
Anesthesia: Monitor Anesthesia Care

## 2016-08-31 MED ORDER — EPHEDRINE 5 MG/ML INJ
INTRAVENOUS | Status: AC
  Filled 2016-08-31: qty 10

## 2016-08-31 MED ORDER — PROPOFOL 10 MG/ML IV BOLUS
INTRAVENOUS | Status: DC | PRN
  Administered 2016-08-31: 50 mg via INTRAVENOUS
  Administered 2016-08-31 (×3): 20 mg via INTRAVENOUS

## 2016-08-31 MED ORDER — LACTATED RINGERS IV SOLN
INTRAVENOUS | Status: DC
  Administered 2016-08-31: 10:00:00 via INTRAVENOUS
  Administered 2016-08-31: 1000 mL via INTRAVENOUS

## 2016-08-31 MED ORDER — PROPOFOL 500 MG/50ML IV EMUL
INTRAVENOUS | Status: DC | PRN
  Administered 2016-08-31: 150 ug/kg/min via INTRAVENOUS

## 2016-08-31 MED ORDER — PROPOFOL 10 MG/ML IV BOLUS
INTRAVENOUS | Status: AC
  Filled 2016-08-31: qty 40

## 2016-08-31 MED ORDER — EPHEDRINE SULFATE-NACL 50-0.9 MG/10ML-% IV SOSY
PREFILLED_SYRINGE | INTRAVENOUS | Status: DC | PRN
  Administered 2016-08-31: 10 mg via INTRAVENOUS

## 2016-08-31 MED ORDER — SODIUM CHLORIDE 0.9 % IV SOLN: INTRAVENOUS | Status: DC

## 2016-08-31 SURGICAL SUPPLY — 21 items

## 2016-08-31 NOTE — Transfer of Care (Signed)
Immediate Anesthesia Transfer of Care Note  Patient: Ian Gilmore  Procedure(s) Performed: Procedure(s): COLONOSCOPY WITH PROPOFOL (N/A)  Patient Location: PACU  Anesthesia Type:MAC  Level of Consciousness: Patient easily awoken, sedated, comfortable, cooperative, following commands, responds to stimulation.   Airway & Oxygen Therapy: Patient spontaneously breathing, ventilating well, oxygen via simple oxygen mask.  Post-op Assessment: Report given to PACU RN, vital signs reviewed and stable, moving all extremities.   Post vital signs: Reviewed and stable.  Complications: No apparent anesthesia complications  Last Vitals:  Vitals:   08/31/16 0840  BP: 129/71  Pulse: 60  Resp: 13  Temp: 36.5 C    Last Pain:  Vitals:   08/31/16 0840  TempSrc: Oral         Complications: No apparent anesthesia complications

## 2016-08-31 NOTE — Op Note (Signed)
[facility] Patient Name: Ian Gilmore Procedure Date: 08/31/2016 MRN: [id number] Attending MD: Garlan Fair , MD Date of Birth: Aug 31, 1945 CSN: [id number] Age: 71 Admit Type: Outpatient Procedure:                Colonoscopy Indications:              High risk colon cancer surveillance: Personal                            history of non-advanced adenoma Providers:                Garlan Fair, MD, Cleda Daub, RN, Cletis Athens, Technician Referring MD:              Medicines:                Propofol per Anesthesia Complications:            No immediate complications. Estimated Blood Loss:     Estimated blood loss: none. Procedure:                Pre-Anesthesia Assessment:                           - Prior to the procedure, a History and Physical                            was performed, and patient medications and                            allergies were reviewed. The patient's tolerance of                            previous anesthesia was also reviewed. The risks                            and benefits of the procedure and the sedation                            options and risks were discussed with the patient.                            All questions were answered, and informed consent                            was obtained. Prior Anticoagulants: The patient has                            taken aspirin, last dose was 1 day prior to                            procedure. ASA Grade Assessment: II - A patient  with mild systemic disease. After reviewing the                            risks and benefits, the patient was deemed in                            satisfactory condition to undergo the procedure.                           After obtaining informed consent, the colonoscope                            was passed under direct vision. Throughout the                            procedure, the patient's  blood pressure, pulse, and                            oxygen saturations were monitored continuously. The                            [id number] scope was introduced through                            the anus and advanced to the the cecum, identified                            by appendiceal orifice and ileocecal valve. The                            colonoscopy was technically difficult and complex                            due to significant looping in the left colon. The                            patient tolerated the procedure well. The quality                            of the bowel preparation was good. The ileocecal                            valve, the appendiceal orifice and the rectum were                            photographed. Scope In: 9:59:09 AM Scope Out: 10:17:45 AM Scope Withdrawal Time: 0 hours 8 minutes 19 seconds  Total Procedure Duration: 0 hours 18 minutes 36 seconds  Findings:      The perianal and digital rectal examinations were normal.      The entire examined colon appeared normal. Impression:               - The entire examined colon is normal.                           -  No specimens collected. Moderate Sedation:      N/A- Per Anesthesia Care Recommendation:           - Patient has a contact number available for                            emergencies. The signs and symptoms of potential                            delayed complications were discussed with the                            patient. Return to normal activities tomorrow.                            Written discharge instructions were provided to the                            patient.                           - Repeat colonoscopy in 10 years for surveillance.                           - Resume previous diet.                           - Continue present medications. Procedure Code(s):        --- Professional ---                           [id number], Colorectal cancer screening; colonoscopy  on                            individual at high risk Diagnosis Code(s):        --- Professional ---                           Z86.010, Personal history of colonic polyps CPT copyright 2016 [employer]. All rights reserved. The codes documented in this report are preliminary and upon coder review may  be revised to meet current compliance requirements. [name], MD Garlan Fair, MD 08/31/2016 10:22:44 AM This report has been signed electronically. Number of Addenda: 0

## 2016-08-31 NOTE — Discharge Instructions (Signed)

## 2016-08-31 NOTE — H&P (Signed)
Procedure: Surveillance colonoscopy. 05/25/2011 normal surveillance colonoscopy was performed. 05/24/2006 colonoscopy was performed with removal of a diminutive hepatic flexure adenomatous polyp  History: The patient is a 71 year old male born 1945/11/12. He is scheduled to undergo a surveillance colonoscopy today.  Past medical history: Herniorrhaphy surgery. 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 Right shoulder arthroscopy. Migraine headache syndrome. Hypercholesterolemia.  Medication allergies: Penicillin  Exam: The patient is alert and lying comfortably on the endoscopy stretcher. Abdomen is soft and nontender to palpation. Cardiac exam reveals a regular rhythm. Lungs are clear to auscultation.  Plan: Proceed with surveillance colonoscopy

## 2016-08-31 NOTE — Anesthesia Preprocedure Evaluation (Addendum)
Anesthesia Evaluation  Patient identified by MRN, date of birth, ID band Patient awake    Reviewed: Allergy & Precautions, NPO status , Patient's Chart, lab work & pertinent test results  Airway Mallampati: II  TM Distance: >3 FB Neck ROM: Full    Dental   Pulmonary neg pulmonary ROS,    breath sounds clear to auscultation       Cardiovascular negative cardio ROS   Rhythm:Regular Rate:Normal     Neuro/Psych negative neurological ROS     GI/Hepatic negative GI ROS, Neg liver ROS,   Endo/Other  negative endocrine ROS  Renal/GU negative Renal ROS     Musculoskeletal   Abdominal   Peds  Hematology negative hematology ROS (+)   Anesthesia Other Findings   Reproductive/Obstetrics                            Anesthesia Physical Anesthesia Plan  ASA: I  Anesthesia Plan: MAC   Post-op Pain Management:    Induction: Intravenous  Airway Management Planned: Natural Airway and Simple Face Mask  Additional Equipment:   Intra-op Plan:   Post-operative Plan:   Informed Consent: I have reviewed the patients History and Physical, chart, labs and discussed the procedure including the risks, benefits and alternatives for the proposed anesthesia with the patient or authorized representative who has indicated his/her understanding and acceptance.     Plan Discussed with: CRNA  Anesthesia Plan Comments:         Anesthesia Quick Evaluation

## 2016-08-31 NOTE — Anesthesia Postprocedure Evaluation (Signed)
Anesthesia Post Note  Patient: Ian Gilmore  Procedure(s) Performed: Procedure(s) (LRB): COLONOSCOPY WITH PROPOFOL (N/A)  Patient location during evaluation: PACU Anesthesia Type: MAC Level of consciousness: awake and alert Pain management: pain level controlled Vital Signs Assessment: post-procedure vital signs reviewed and stable Respiratory status: spontaneous breathing, nonlabored ventilation, respiratory function stable and patient connected to nasal cannula oxygen Cardiovascular status: stable and blood pressure returned to baseline Anesthetic complications: no       Last Vitals:  Vitals:   08/31/16 1040 08/31/16 1050  BP: 114/60 128/82  Pulse:    Resp:    Temp:      Last Pain:  Vitals:   08/31/16 1025  TempSrc: Oral                 [name]

## 2016-09-03 ENCOUNTER — Encounter (HOSPITAL_COMMUNITY): Payer: Self-pay | Admitting: Gastroenterology

## 2016-11-24 DIAGNOSIS — Z85828 Personal history of other malignant neoplasm of skin: Secondary | ICD-10-CM | POA: Diagnosis not present

## 2016-11-24 DIAGNOSIS — Z08 Encounter for follow-up examination after completed treatment for malignant neoplasm: Secondary | ICD-10-CM | POA: Diagnosis not present

## 2016-11-24 DIAGNOSIS — L570 Actinic keratosis: Secondary | ICD-10-CM | POA: Diagnosis not present

## 2017-06-21 DIAGNOSIS — Z86010 Personal history of colonic polyps: Secondary | ICD-10-CM | POA: Diagnosis not present

## 2017-06-21 DIAGNOSIS — Z23 Encounter for immunization: Secondary | ICD-10-CM | POA: Diagnosis not present

## 2017-06-21 DIAGNOSIS — E7800 Pure hypercholesterolemia, unspecified: Secondary | ICD-10-CM | POA: Diagnosis not present

## 2017-06-21 DIAGNOSIS — N183 Chronic kidney disease, stage 3 (moderate): Secondary | ICD-10-CM | POA: Diagnosis not present

## 2017-06-21 DIAGNOSIS — E663 Overweight: Secondary | ICD-10-CM | POA: Diagnosis not present

## 2017-06-21 DIAGNOSIS — Z6827 Body mass index (BMI) 27.0-27.9, adult: Secondary | ICD-10-CM | POA: Diagnosis not present

## 2017-06-21 DIAGNOSIS — Z0000 Encounter for general adult medical examination without abnormal findings: Secondary | ICD-10-CM | POA: Diagnosis not present

## 2017-06-21 DIAGNOSIS — Z79899 Other long term (current) drug therapy: Secondary | ICD-10-CM | POA: Diagnosis not present

## 2017-06-21 DIAGNOSIS — Z1331 Encounter for screening for depression: Secondary | ICD-10-CM | POA: Diagnosis not present

## 2017-06-21 DIAGNOSIS — Z125 Encounter for screening for malignant neoplasm of prostate: Secondary | ICD-10-CM | POA: Diagnosis not present

## 2017-06-21 DIAGNOSIS — N529 Male erectile dysfunction, unspecified: Secondary | ICD-10-CM | POA: Diagnosis not present

## 2017-09-14 DIAGNOSIS — X32XXXS Exposure to sunlight, sequela: Secondary | ICD-10-CM | POA: Diagnosis not present

## 2017-09-14 DIAGNOSIS — L814 Other melanin hyperpigmentation: Secondary | ICD-10-CM | POA: Diagnosis not present

## 2017-09-14 DIAGNOSIS — D1801 Hemangioma of skin and subcutaneous tissue: Secondary | ICD-10-CM | POA: Diagnosis not present

## 2017-09-14 DIAGNOSIS — L570 Actinic keratosis: Secondary | ICD-10-CM | POA: Diagnosis not present

## 2017-09-14 DIAGNOSIS — L821 Other seborrheic keratosis: Secondary | ICD-10-CM | POA: Diagnosis not present

## 2017-11-23 DIAGNOSIS — R69 Illness, unspecified: Secondary | ICD-10-CM | POA: Diagnosis not present

## 2018-01-20 ENCOUNTER — Other Ambulatory Visit (HOSPITAL_COMMUNITY): Payer: Self-pay | Admitting: Geriatric Medicine

## 2018-01-20 ENCOUNTER — Other Ambulatory Visit: Payer: Self-pay | Admitting: Geriatric Medicine

## 2018-01-20 DIAGNOSIS — R1011 Right upper quadrant pain: Secondary | ICD-10-CM

## 2018-01-24 ENCOUNTER — Ambulatory Visit (HOSPITAL_COMMUNITY)
Admission: RE | Admit: 2018-01-24 | Discharge: 2018-01-24 | Disposition: A | Discharge status: Home / Self Care | Payer: Medicare HMO | Source: Ambulatory Visit | Attending: Geriatric Medicine | Admitting: Geriatric Medicine

## 2018-01-24 DIAGNOSIS — R1011 Right upper quadrant pain: Secondary | ICD-10-CM | POA: Diagnosis not present

## 2018-01-25 DIAGNOSIS — B029 Zoster without complications: Secondary | ICD-10-CM | POA: Diagnosis not present

## 2018-05-05 DIAGNOSIS — H5213 Myopia, bilateral: Secondary | ICD-10-CM | POA: Diagnosis not present

## 2018-05-15 DIAGNOSIS — R69 Illness, unspecified: Secondary | ICD-10-CM | POA: Diagnosis not present

## 2018-06-21 DIAGNOSIS — Z85828 Personal history of other malignant neoplasm of skin: Secondary | ICD-10-CM | POA: Diagnosis not present

## 2018-06-21 DIAGNOSIS — L570 Actinic keratosis: Secondary | ICD-10-CM | POA: Diagnosis not present

## 2018-06-21 DIAGNOSIS — Z08 Encounter for follow-up examination after completed treatment for malignant neoplasm: Secondary | ICD-10-CM | POA: Diagnosis not present

## 2018-06-21 DIAGNOSIS — L821 Other seborrheic keratosis: Secondary | ICD-10-CM | POA: Diagnosis not present

## 2018-07-04 ENCOUNTER — Other Ambulatory Visit: Payer: Self-pay | Admitting: Geriatric Medicine

## 2018-07-04 ENCOUNTER — Ambulatory Visit
Admission: RE | Admit: 2018-07-04 | Discharge: 2018-07-04 | Disposition: A | Discharge status: Home / Self Care | Payer: Medicare HMO | Source: Ambulatory Visit | Attending: Geriatric Medicine | Admitting: Geriatric Medicine

## 2018-07-04 DIAGNOSIS — Z0000 Encounter for general adult medical examination without abnormal findings: Secondary | ICD-10-CM | POA: Diagnosis not present

## 2018-07-04 DIAGNOSIS — N183 Chronic kidney disease, stage 3 (moderate): Secondary | ICD-10-CM | POA: Diagnosis not present

## 2018-07-04 DIAGNOSIS — Z79899 Other long term (current) drug therapy: Secondary | ICD-10-CM | POA: Diagnosis not present

## 2018-07-04 DIAGNOSIS — M542 Cervicalgia: Secondary | ICD-10-CM | POA: Diagnosis not present

## 2018-07-04 DIAGNOSIS — Z1389 Encounter for screening for other disorder: Secondary | ICD-10-CM | POA: Diagnosis not present

## 2018-07-04 DIAGNOSIS — E7800 Pure hypercholesterolemia, unspecified: Secondary | ICD-10-CM | POA: Diagnosis not present

## 2018-07-04 DIAGNOSIS — R252 Cramp and spasm: Secondary | ICD-10-CM | POA: Diagnosis not present

## 2018-07-06 ENCOUNTER — Other Ambulatory Visit: Payer: Self-pay | Admitting: Geriatric Medicine

## 2018-07-06 DIAGNOSIS — E7800 Pure hypercholesterolemia, unspecified: Secondary | ICD-10-CM

## 2018-07-21 ENCOUNTER — Other Ambulatory Visit: Payer: Medicare HMO

## 2018-08-08 DIAGNOSIS — R69 Illness, unspecified: Secondary | ICD-10-CM | POA: Diagnosis not present

## 2018-11-14 DIAGNOSIS — E7800 Pure hypercholesterolemia, unspecified: Secondary | ICD-10-CM | POA: Diagnosis not present

## 2019-05-05 DIAGNOSIS — R69 Illness, unspecified: Secondary | ICD-10-CM | POA: Diagnosis not present

## 2019-07-26 DIAGNOSIS — I83813 Varicose veins of bilateral lower extremities with pain: Secondary | ICD-10-CM | POA: Diagnosis not present

## 2019-07-26 DIAGNOSIS — Z1389 Encounter for screening for other disorder: Secondary | ICD-10-CM | POA: Diagnosis not present

## 2019-07-26 DIAGNOSIS — N1831 Chronic kidney disease, stage 3a: Secondary | ICD-10-CM | POA: Diagnosis not present

## 2019-07-26 DIAGNOSIS — R0789 Other chest pain: Secondary | ICD-10-CM | POA: Diagnosis not present

## 2019-07-26 DIAGNOSIS — Z23 Encounter for immunization: Secondary | ICD-10-CM | POA: Diagnosis not present

## 2019-07-26 DIAGNOSIS — J3089 Other allergic rhinitis: Secondary | ICD-10-CM | POA: Diagnosis not present

## 2019-07-26 DIAGNOSIS — Z79899 Other long term (current) drug therapy: Secondary | ICD-10-CM | POA: Diagnosis not present

## 2019-07-26 DIAGNOSIS — Z0000 Encounter for general adult medical examination without abnormal findings: Secondary | ICD-10-CM | POA: Diagnosis not present

## 2019-07-26 DIAGNOSIS — E7800 Pure hypercholesterolemia, unspecified: Secondary | ICD-10-CM | POA: Diagnosis not present

## 2019-07-26 DIAGNOSIS — R7309 Other abnormal glucose: Secondary | ICD-10-CM | POA: Diagnosis not present

## 2019-08-01 DIAGNOSIS — L720 Epidermal cyst: Secondary | ICD-10-CM | POA: Diagnosis not present

## 2019-08-01 DIAGNOSIS — L2089 Other atopic dermatitis: Secondary | ICD-10-CM | POA: Diagnosis not present

## 2019-08-01 DIAGNOSIS — L281 Prurigo nodularis: Secondary | ICD-10-CM | POA: Diagnosis not present

## 2019-08-01 DIAGNOSIS — L905 Scar conditions and fibrosis of skin: Secondary | ICD-10-CM | POA: Diagnosis not present

## 2019-08-01 DIAGNOSIS — D2261 Melanocytic nevi of right upper limb, including shoulder: Secondary | ICD-10-CM | POA: Diagnosis not present

## 2019-08-01 DIAGNOSIS — L570 Actinic keratosis: Secondary | ICD-10-CM | POA: Diagnosis not present

## 2019-08-01 DIAGNOSIS — L821 Other seborrheic keratosis: Secondary | ICD-10-CM | POA: Diagnosis not present

## 2019-08-01 DIAGNOSIS — D2262 Melanocytic nevi of left upper limb, including shoulder: Secondary | ICD-10-CM | POA: Diagnosis not present

## 2019-08-01 DIAGNOSIS — D225 Melanocytic nevi of trunk: Secondary | ICD-10-CM | POA: Diagnosis not present

## 2019-08-02 ENCOUNTER — Other Ambulatory Visit: Payer: Self-pay | Admitting: Geriatric Medicine

## 2019-08-02 DIAGNOSIS — E7800 Pure hypercholesterolemia, unspecified: Secondary | ICD-10-CM

## 2019-08-22 ENCOUNTER — Ambulatory Visit
Admission: RE | Admit: 2019-08-22 | Discharge: 2019-08-22 | Disposition: A | Discharge status: Home / Self Care | Payer: Medicare HMO | Source: Ambulatory Visit | Attending: Geriatric Medicine | Admitting: Geriatric Medicine

## 2019-08-22 DIAGNOSIS — E7800 Pure hypercholesterolemia, unspecified: Secondary | ICD-10-CM

## 2019-10-12 DIAGNOSIS — R69 Illness, unspecified: Secondary | ICD-10-CM | POA: Diagnosis not present

## 2020-01-02 DIAGNOSIS — C44729 Squamous cell carcinoma of skin of left lower limb, including hip: Secondary | ICD-10-CM | POA: Diagnosis not present

## 2020-01-02 DIAGNOSIS — L570 Actinic keratosis: Secondary | ICD-10-CM | POA: Diagnosis not present

## 2020-02-25 DIAGNOSIS — R768 Other specified abnormal immunological findings in serum: Secondary | ICD-10-CM | POA: Diagnosis not present

## 2020-04-16 DIAGNOSIS — H5213 Myopia, bilateral: Secondary | ICD-10-CM | POA: Diagnosis not present

## 2020-05-08 DIAGNOSIS — R69 Illness, unspecified: Secondary | ICD-10-CM | POA: Diagnosis not present

## 2020-05-21 DIAGNOSIS — R69 Illness, unspecified: Secondary | ICD-10-CM | POA: Diagnosis not present

## 2020-07-19 ENCOUNTER — Encounter (HOSPITAL_COMMUNITY): Payer: Self-pay | Admitting: Emergency Medicine

## 2020-07-19 ENCOUNTER — Emergency Department (HOSPITAL_COMMUNITY): Payer: Medicare HMO

## 2020-07-19 ENCOUNTER — Emergency Department (HOSPITAL_COMMUNITY)
Admission: EM | Admit: 2020-07-19 | Discharge: 2020-07-19 | Disposition: A | Discharge status: Home / Self Care | Payer: Medicare HMO | Attending: Emergency Medicine | Admitting: Emergency Medicine

## 2020-07-19 DIAGNOSIS — S62630B Displaced fracture of distal phalanx of right index finger, initial encounter for open fracture: Secondary | ICD-10-CM | POA: Diagnosis not present

## 2020-07-19 DIAGNOSIS — W28XXXA Contact with powered lawn mower, initial encounter: Secondary | ICD-10-CM | POA: Diagnosis not present

## 2020-07-19 DIAGNOSIS — S62632B Displaced fracture of distal phalanx of right middle finger, initial encounter for open fracture: Secondary | ICD-10-CM | POA: Diagnosis not present

## 2020-07-19 DIAGNOSIS — Y9389 Activity, other specified: Secondary | ICD-10-CM | POA: Diagnosis not present

## 2020-07-19 DIAGNOSIS — Z23 Encounter for immunization: Secondary | ICD-10-CM | POA: Insufficient documentation

## 2020-07-19 DIAGNOSIS — S62632A Displaced fracture of distal phalanx of right middle finger, initial encounter for closed fracture: Secondary | ICD-10-CM | POA: Diagnosis not present

## 2020-07-19 DIAGNOSIS — S6991XA Unspecified injury of right wrist, hand and finger(s), initial encounter: Secondary | ICD-10-CM | POA: Diagnosis not present

## 2020-07-19 DIAGNOSIS — S62630A Displaced fracture of distal phalanx of right index finger, initial encounter for closed fracture: Secondary | ICD-10-CM | POA: Diagnosis not present

## 2020-07-19 MED ORDER — CEFAZOLIN SODIUM 1 G IJ SOLR
2.0000 g | Freq: Once | INTRAMUSCULAR | Status: AC
  Administered 2020-07-19: 2 g via INTRAMUSCULAR
  Filled 2020-07-19 (×2): qty 20

## 2020-07-19 MED ORDER — TETANUS-DIPHTH-ACELL PERTUSSIS 5-2.5-18.5 LF-MCG/0.5 IM SUSY
0.5000 mL | PREFILLED_SYRINGE | Freq: Once | INTRAMUSCULAR | Status: AC
  Administered 2020-07-19: 0.5 mL via INTRAMUSCULAR
  Filled 2020-07-19: qty 0.5

## 2020-07-19 MED ORDER — STERILE WATER FOR INJECTION IJ SOLN
INTRAMUSCULAR | Status: AC
  Filled 2020-07-19: qty 10

## 2020-07-19 MED ORDER — LIDOCAINE HCL (PF) 1 % IJ SOLN
20.0000 mL | Freq: Once | INTRAMUSCULAR | Status: AC
  Administered 2020-07-19: 20 mL
  Filled 2020-07-19: qty 20

## 2020-07-19 MED ORDER — CEPHALEXIN 500 MG PO CAPS: 500.0000 mg | ORAL_CAPSULE | Freq: Three times a day (TID) | ORAL | 0 refills | Status: AC

## 2020-07-19 NOTE — Discharge Instructions (Signed)
Take antibiotics as prescribed. Take the entire course.  Use Tylenol or ibuprofen as needed for mild to moderate pain.   Keep your hand elevated to help with pain and swelling. Call the hand doctor listed below to set up an appointment for further management of your fingers. Try to keep the dressings clean and dry. Return to the emergency room if you develop high fevers, severe worsening pain, persistent bleeding from under the dressing, any new, worsening, or concerning symptoms.

## 2020-07-19 NOTE — ED Notes (Addendum)
Cleansed pt's right hand in sterile saline and dressed with nonadherent dressing

## 2020-07-19 NOTE — ED Triage Notes (Signed)
Pt reports cleaning his lawnmower today and has cuts to right index and middle fingers.

## 2020-07-19 NOTE — ED Provider Notes (Signed)
[facility] EMERGENCY DEPARTMENT Provider Note   CSN: [id number] Arrival date & time: 07/19/20  1305     History No chief complaint on file.   Ian Gilmore is a 75 y.o. male presenting for evaluation of R hand injury.   Pt states he was cleaning his lawn mower around 1215 when his hand slipped and he cut his R index and middle fingers. He denies numbness or significant pain.  Has not taken anything for pain.  He is not on blood thinners.  He is not immunocompromised.  He is left-handed.  He denies injury elsewhere. He has a h/o elevated HDL for which he takes a statin, no other medical problems.   HPI     Past Medical History:  Diagnosis Date  . Medical history non-contributory     There are no problems to display for this patient.   Past Surgical History:  Procedure Laterality Date  . COLONOSCOPY WITH PROPOFOL N/A 08/31/2016   Procedure: COLONOSCOPY WITH PROPOFOL;  Surgeon: Garlan Fair, MD;  Location: [facility] ENDOSCOPY;  Service: Endoscopy;  Laterality: N/A;  . SHOULDER ARTHROSCOPY         No family history on file.  Social History   Tobacco Use  . Smoking status: Never Smoker  . Smokeless tobacco: Never Used  Substance Use Topics  . Alcohol use: No  . Drug use: No    Home Medications Prior to Admission medications   Medication Sig Start Date End Date Taking? Authorizing Provider  cephALEXin (KEFLEX) 500 MG capsule Take 1 capsule (500 mg total) by mouth 3 (three) times daily for 5 days. 07/19/20 07/24/20 Yes Seriyah Collison, PA-C    Allergies    Patient has no known allergies.  Review of Systems   Review of Systems  Skin: Positive for wound.  All other systems reviewed and are negative.   Physical Exam Updated Vital Signs BP (!) 161/79   Pulse 66   Temp 97.6 F (36.4 C) (Oral)   Resp 18   Ht 5\' 10"  (1.778 m)   Wt 78 kg   SpO2 100%   BMI 24.68 kg/m   Physical Exam Vitals and nursing note reviewed.  Constitutional:       General: He is not in acute distress.    Appearance: He is well-developed and well-nourished.     Comments: Appears nontoxic  HENT:     Head: Normocephalic and atraumatic.  Eyes:     Extraocular Movements: EOM normal.  Pulmonary:     Effort: Pulmonary effort is normal.  Abdominal:     General: There is no distension.  Musculoskeletal:        General: Deformity and signs of injury present. Normal range of motion.     Cervical back: Normal range of motion.     Comments: See pictures below.  Significant injury of the right index and middle distal fingers.  Index finger nail removed prior to arrival, avulsion of the middle finger nail.  Full active range of motion when held in isolation at the MCP, PIP, and DIP of both fingers.  Good distal sensation.  Minimal bleeding.  Skin:    General: Skin is warm.     Capillary Refill: Capillary refill takes less than 2 seconds.     Findings: No rash.  Neurological:     Mental Status: He is alert and oriented to person, place, and time.  Psychiatric:        Mood and Affect: Mood and  affect normal.            ED Results / Procedures / Treatments   Labs (all labs ordered are listed, but only abnormal results are displayed) Labs Reviewed - No data to display  EKG None  Radiology DG Hand Complete Right  Result Date: 07/19/2020 CLINICAL DATA:  Right hand injury with laceration to the index finger and middle finger. EXAM: RIGHT HAND - COMPLETE 3+ VIEW COMPARISON:  None. FINDINGS: Comminuted displaced fractures of the second and third distal phalanges with deformity of the soft tissues of the distal second and third digits are noted. No other acute fracture or dislocation is identified. IMPRESSION: Comminuted displaced fractures of the second and third distal phalanges with deformity of the soft tissues of the distal second and third digits are noted. Electronically Signed   By: Sherian ReinWei-Chen  Lin M.D.   On: 07/19/2020 15:43     Procedures Irrigation  Date/Time: 07/19/2020 8:09 PM Performed by: Alveria Apleyaccavale, Jawara Latorre, PA-C Authorized by: Alveria Apleyaccavale, Valena Ivanov, PA-C  Consent: Verbal consent obtained. Risks and benefits: risks, benefits and alternatives were discussed Preparation: Patient was prepped and draped in the usual sterile fashion. Local anesthesia used: yes Anesthesia: digital block (tendon sheath digital block of the R index finger)  Anesthesia: Local anesthesia used: yes Local Anesthetic: lidocaine 1% without epinephrine Anesthetic total: 8 mL  Sedation: Patient sedated: no  Patient tolerance: patient tolerated the procedure well with no immediate complications Comments: Patient with open fracture of the distal right index finger requiring a digital block and extensive irrigation with wound care.  Irrigation  Date/Time: 07/19/2020 8:11 PM Performed by: Alveria Apleyaccavale, Euphemia Lingerfelt, PA-C Authorized by: Alveria Apleyaccavale, Lupie Sawa, PA-C  Consent: Verbal consent obtained. Risks and benefits: risks, benefits and alternatives were discussed Preparation: Patient was prepped and draped in the usual sterile fashion. Local anesthesia used: yes Anesthesia: digital block (tendon sheath block of the R middle finger)  Anesthesia: Local anesthesia used: yes Local Anesthetic: lidocaine 1% without epinephrine Anesthetic total: 8 mL Patient tolerance: patient tolerated the procedure well with no immediate complications Comments: Patient with open fracture of the distal right middle finger requiring a digital block and extensive irrigation with wound care.    (including critical care time)  Medications Ordered in ED Medications  sterile water (preservative free) injection (has no administration in time range)  ceFAZolin (ANCEF) injection 2 g (2 g Intramuscular Given 07/19/20 1833)  lidocaine (PF) (XYLOCAINE) 1 % injection 20 mL (20 mLs Infiltration Given by Other 07/19/20 1800)  Tdap (BOOSTRIX) injection 0.5 mL (0.5 mLs  Intramuscular Given 07/19/20 1832)    ED Course  I have reviewed the triage vital signs and the nursing notes.  Pertinent labs & imaging results that were available during my care of the patient were reviewed by me and considered in my medical decision making (see chart for details).    MDM Rules/Calculators/A&P                          Pt presenting for evaluation of R hand injury.  On exam, he is neurovascular intact, though he does have obvious deformity of the distal right index and middle fingers.  Will obtain x-ray, as I have high suspicion for bony abnormality.  X-rays viewed interpreted by me shows comminuted displaced fractures of the distal phalanx of the right middle and index fingers.  Will consult with hand.  Discussed with Dr. Orlan Leavensrtman from hand surgery, who recommends digital block in the ED, irrigation  and wound care and follow-up in the office.  Irrigation performed as described above.  Patient given Ancef and tetanus booster.  Discussed importance of close follow-up with hand surgery.  As patient has an open fracture, he is discharged on antibiotics.  Offered pain medication, patient declined.  At this time, patient appear safe for discharge.  Return precautions given.  Patient states he understands and agrees to plan.  Final Clinical Impression(s) / ED Diagnoses Final diagnoses:  Displaced fracture of distal phalanx of right index finger, initial encounter for open fracture  Displaced fracture of distal phalanx of right middle finger, initial encounter for open fracture    Rx / DC Orders ED Discharge Orders         Ordered    cephALEXin (KEFLEX) 500 MG capsule  3 times daily        07/19/20 1815           Alveria Apley, PA-C 07/19/20 2014    Derwood Kaplan, MD 07/21/20 2334

## 2020-07-22 DIAGNOSIS — M79641 Pain in right hand: Secondary | ICD-10-CM | POA: Diagnosis not present

## 2020-07-25 DIAGNOSIS — S62632B Displaced fracture of distal phalanx of right middle finger, initial encounter for open fracture: Secondary | ICD-10-CM | POA: Diagnosis not present

## 2020-07-25 DIAGNOSIS — W28XXXA Contact with powered lawn mower, initial encounter: Secondary | ICD-10-CM | POA: Diagnosis not present

## 2020-07-25 DIAGNOSIS — S62630B Displaced fracture of distal phalanx of right index finger, initial encounter for open fracture: Secondary | ICD-10-CM | POA: Diagnosis not present

## 2020-08-01 DIAGNOSIS — M79641 Pain in right hand: Secondary | ICD-10-CM | POA: Diagnosis not present

## 2020-08-12 DIAGNOSIS — Z4789 Encounter for other orthopedic aftercare: Secondary | ICD-10-CM | POA: Diagnosis not present

## 2020-08-12 DIAGNOSIS — S62632B Displaced fracture of distal phalanx of right middle finger, initial encounter for open fracture: Secondary | ICD-10-CM | POA: Diagnosis not present

## 2020-08-12 DIAGNOSIS — S62630B Displaced fracture of distal phalanx of right index finger, initial encounter for open fracture: Secondary | ICD-10-CM | POA: Diagnosis not present

## 2020-08-14 DIAGNOSIS — Z1389 Encounter for screening for other disorder: Secondary | ICD-10-CM | POA: Diagnosis not present

## 2020-08-14 DIAGNOSIS — I700 Atherosclerosis of aorta: Secondary | ICD-10-CM | POA: Diagnosis not present

## 2020-08-14 DIAGNOSIS — Z79899 Other long term (current) drug therapy: Secondary | ICD-10-CM | POA: Diagnosis not present

## 2020-08-14 DIAGNOSIS — E7800 Pure hypercholesterolemia, unspecified: Secondary | ICD-10-CM | POA: Diagnosis not present

## 2020-08-14 DIAGNOSIS — N1831 Chronic kidney disease, stage 3a: Secondary | ICD-10-CM | POA: Diagnosis not present

## 2020-08-14 DIAGNOSIS — Z0000 Encounter for general adult medical examination without abnormal findings: Secondary | ICD-10-CM | POA: Diagnosis not present

## 2020-08-26 DIAGNOSIS — S62632B Displaced fracture of distal phalanx of right middle finger, initial encounter for open fracture: Secondary | ICD-10-CM | POA: Diagnosis not present

## 2020-08-26 DIAGNOSIS — S62630B Displaced fracture of distal phalanx of right index finger, initial encounter for open fracture: Secondary | ICD-10-CM | POA: Diagnosis not present

## 2020-09-17 DIAGNOSIS — Z85828 Personal history of other malignant neoplasm of skin: Secondary | ICD-10-CM | POA: Diagnosis not present

## 2020-09-17 DIAGNOSIS — L578 Other skin changes due to chronic exposure to nonionizing radiation: Secondary | ICD-10-CM | POA: Diagnosis not present

## 2020-09-17 DIAGNOSIS — L821 Other seborrheic keratosis: Secondary | ICD-10-CM | POA: Diagnosis not present

## 2020-09-17 DIAGNOSIS — L570 Actinic keratosis: Secondary | ICD-10-CM | POA: Diagnosis not present

## 2020-09-26 DIAGNOSIS — S62632D Displaced fracture of distal phalanx of right middle finger, subsequent encounter for fracture with routine healing: Secondary | ICD-10-CM | POA: Diagnosis not present

## 2020-09-30 DIAGNOSIS — M79641 Pain in right hand: Secondary | ICD-10-CM | POA: Diagnosis not present

## 2020-09-30 DIAGNOSIS — M79644 Pain in right finger(s): Secondary | ICD-10-CM | POA: Diagnosis not present

## 2020-10-07 DIAGNOSIS — M25541 Pain in joints of right hand: Secondary | ICD-10-CM | POA: Diagnosis not present

## 2020-10-14 DIAGNOSIS — M25541 Pain in joints of right hand: Secondary | ICD-10-CM | POA: Diagnosis not present

## 2020-10-21 DIAGNOSIS — M25541 Pain in joints of right hand: Secondary | ICD-10-CM | POA: Diagnosis not present

## 2020-10-28 DIAGNOSIS — M79644 Pain in right finger(s): Secondary | ICD-10-CM | POA: Diagnosis not present

## 2020-11-04 DIAGNOSIS — S62632D Displaced fracture of distal phalanx of right middle finger, subsequent encounter for fracture with routine healing: Secondary | ICD-10-CM | POA: Diagnosis not present

## 2020-11-04 DIAGNOSIS — S62630B Displaced fracture of distal phalanx of right index finger, initial encounter for open fracture: Secondary | ICD-10-CM | POA: Diagnosis not present

## 2020-11-04 DIAGNOSIS — M25531 Pain in right wrist: Secondary | ICD-10-CM | POA: Diagnosis not present

## 2020-11-12 DIAGNOSIS — G5601 Carpal tunnel syndrome, right upper limb: Secondary | ICD-10-CM | POA: Diagnosis not present

## 2020-11-21 DIAGNOSIS — G5601 Carpal tunnel syndrome, right upper limb: Secondary | ICD-10-CM | POA: Diagnosis not present

## 2020-12-01 DIAGNOSIS — G5601 Carpal tunnel syndrome, right upper limb: Secondary | ICD-10-CM | POA: Diagnosis not present

## 2020-12-01 DIAGNOSIS — S62632B Displaced fracture of distal phalanx of right middle finger, initial encounter for open fracture: Secondary | ICD-10-CM | POA: Diagnosis not present

## 2020-12-01 DIAGNOSIS — S62630B Displaced fracture of distal phalanx of right index finger, initial encounter for open fracture: Secondary | ICD-10-CM | POA: Diagnosis not present

## 2020-12-09 DIAGNOSIS — S62632D Displaced fracture of distal phalanx of right middle finger, subsequent encounter for fracture with routine healing: Secondary | ICD-10-CM | POA: Diagnosis not present

## 2020-12-16 DIAGNOSIS — G5601 Carpal tunnel syndrome, right upper limb: Secondary | ICD-10-CM | POA: Diagnosis not present

## 2021-03-20 DIAGNOSIS — S62632D Displaced fracture of distal phalanx of right middle finger, subsequent encounter for fracture with routine healing: Secondary | ICD-10-CM | POA: Diagnosis not present

## 2021-03-20 DIAGNOSIS — G5601 Carpal tunnel syndrome, right upper limb: Secondary | ICD-10-CM | POA: Diagnosis not present

## 2021-05-01 DIAGNOSIS — S62632D Displaced fracture of distal phalanx of right middle finger, subsequent encounter for fracture with routine healing: Secondary | ICD-10-CM | POA: Diagnosis not present

## 2021-05-11 DIAGNOSIS — L570 Actinic keratosis: Secondary | ICD-10-CM | POA: Diagnosis not present

## 2021-08-05 DIAGNOSIS — L723 Sebaceous cyst: Secondary | ICD-10-CM | POA: Diagnosis not present

## 2021-08-24 DIAGNOSIS — Z0000 Encounter for general adult medical examination without abnormal findings: Secondary | ICD-10-CM | POA: Diagnosis not present

## 2021-08-24 DIAGNOSIS — E7800 Pure hypercholesterolemia, unspecified: Secondary | ICD-10-CM | POA: Diagnosis not present

## 2021-08-24 DIAGNOSIS — M545 Low back pain, unspecified: Secondary | ICD-10-CM | POA: Diagnosis not present

## 2021-08-24 DIAGNOSIS — Z86010 Personal history of colonic polyps: Secondary | ICD-10-CM | POA: Diagnosis not present

## 2021-08-24 DIAGNOSIS — R202 Paresthesia of skin: Secondary | ICD-10-CM | POA: Diagnosis not present

## 2021-08-24 DIAGNOSIS — Z1389 Encounter for screening for other disorder: Secondary | ICD-10-CM | POA: Diagnosis not present

## 2021-08-24 DIAGNOSIS — Z79899 Other long term (current) drug therapy: Secondary | ICD-10-CM | POA: Diagnosis not present

## 2021-08-24 DIAGNOSIS — N1831 Chronic kidney disease, stage 3a: Secondary | ICD-10-CM | POA: Diagnosis not present

## 2021-08-24 DIAGNOSIS — I700 Atherosclerosis of aorta: Secondary | ICD-10-CM | POA: Diagnosis not present

## 2021-08-24 DIAGNOSIS — J3089 Other allergic rhinitis: Secondary | ICD-10-CM | POA: Diagnosis not present

## 2021-08-24 DIAGNOSIS — G8929 Other chronic pain: Secondary | ICD-10-CM | POA: Diagnosis not present

## 2021-09-09 DIAGNOSIS — L723 Sebaceous cyst: Secondary | ICD-10-CM | POA: Diagnosis not present

## 2021-09-18 DIAGNOSIS — L723 Sebaceous cyst: Secondary | ICD-10-CM | POA: Diagnosis not present

## 2021-09-18 DIAGNOSIS — L720 Epidermal cyst: Secondary | ICD-10-CM | POA: Diagnosis not present

## 2021-09-21 DIAGNOSIS — Z85828 Personal history of other malignant neoplasm of skin: Secondary | ICD-10-CM | POA: Diagnosis not present

## 2021-09-21 DIAGNOSIS — L821 Other seborrheic keratosis: Secondary | ICD-10-CM | POA: Diagnosis not present

## 2021-09-21 DIAGNOSIS — L570 Actinic keratosis: Secondary | ICD-10-CM | POA: Diagnosis not present

## 2021-09-21 DIAGNOSIS — L578 Other skin changes due to chronic exposure to nonionizing radiation: Secondary | ICD-10-CM | POA: Diagnosis not present

## 2021-12-01 DIAGNOSIS — T7840XA Allergy, unspecified, initial encounter: Secondary | ICD-10-CM | POA: Diagnosis not present

## 2021-12-01 DIAGNOSIS — S68112A Complete traumatic metacarpophalangeal amputation of right middle finger, initial encounter: Secondary | ICD-10-CM | POA: Diagnosis not present

## 2021-12-01 DIAGNOSIS — S68110A Complete traumatic metacarpophalangeal amputation of right index finger, initial encounter: Secondary | ICD-10-CM | POA: Diagnosis not present

## 2022-02-10 DIAGNOSIS — H43813 Vitreous degeneration, bilateral: Secondary | ICD-10-CM | POA: Diagnosis not present

## 2022-04-21 DIAGNOSIS — K644 Residual hemorrhoidal skin tags: Secondary | ICD-10-CM | POA: Diagnosis not present

## 2022-04-21 DIAGNOSIS — Z09 Encounter for follow-up examination after completed treatment for conditions other than malignant neoplasm: Secondary | ICD-10-CM | POA: Diagnosis not present

## 2022-04-21 DIAGNOSIS — K635 Polyp of colon: Secondary | ICD-10-CM | POA: Diagnosis not present

## 2022-04-21 DIAGNOSIS — Z86010 Personal history of colonic polyps: Secondary | ICD-10-CM | POA: Diagnosis not present

## 2022-04-21 DIAGNOSIS — K5730 Diverticulosis of large intestine without perforation or abscess without bleeding: Secondary | ICD-10-CM | POA: Diagnosis not present

## 2022-04-21 DIAGNOSIS — K648 Other hemorrhoids: Secondary | ICD-10-CM | POA: Diagnosis not present

## 2022-04-23 DIAGNOSIS — K635 Polyp of colon: Secondary | ICD-10-CM | POA: Diagnosis not present

## 2022-11-09 DIAGNOSIS — I2510 Atherosclerotic heart disease of native coronary artery without angina pectoris: Secondary | ICD-10-CM | POA: Diagnosis not present

## 2022-11-09 DIAGNOSIS — J3089 Other allergic rhinitis: Secondary | ICD-10-CM | POA: Diagnosis not present

## 2022-11-09 DIAGNOSIS — R252 Cramp and spasm: Secondary | ICD-10-CM | POA: Diagnosis not present

## 2022-11-09 DIAGNOSIS — R202 Paresthesia of skin: Secondary | ICD-10-CM | POA: Diagnosis not present

## 2022-11-09 DIAGNOSIS — Z23 Encounter for immunization: Secondary | ICD-10-CM | POA: Diagnosis not present

## 2022-11-09 DIAGNOSIS — M545 Low back pain, unspecified: Secondary | ICD-10-CM | POA: Diagnosis not present

## 2022-11-09 DIAGNOSIS — E7800 Pure hypercholesterolemia, unspecified: Secondary | ICD-10-CM | POA: Diagnosis not present

## 2022-11-09 DIAGNOSIS — D692 Other nonthrombocytopenic purpura: Secondary | ICD-10-CM | POA: Diagnosis not present

## 2022-11-09 DIAGNOSIS — Z0000 Encounter for general adult medical examination without abnormal findings: Secondary | ICD-10-CM | POA: Diagnosis not present

## 2022-11-09 DIAGNOSIS — N1831 Chronic kidney disease, stage 3a: Secondary | ICD-10-CM | POA: Diagnosis not present

## 2022-11-09 DIAGNOSIS — I700 Atherosclerosis of aorta: Secondary | ICD-10-CM | POA: Diagnosis not present

## 2022-11-09 DIAGNOSIS — Z79899 Other long term (current) drug therapy: Secondary | ICD-10-CM | POA: Diagnosis not present

## 2022-11-09 DIAGNOSIS — R739 Hyperglycemia, unspecified: Secondary | ICD-10-CM | POA: Diagnosis not present

## 2022-11-29 DIAGNOSIS — Z85828 Personal history of other malignant neoplasm of skin: Secondary | ICD-10-CM | POA: Diagnosis not present

## 2022-11-29 DIAGNOSIS — L570 Actinic keratosis: Secondary | ICD-10-CM | POA: Diagnosis not present

## 2022-11-29 DIAGNOSIS — L814 Other melanin hyperpigmentation: Secondary | ICD-10-CM | POA: Diagnosis not present

## 2022-11-29 DIAGNOSIS — L821 Other seborrheic keratosis: Secondary | ICD-10-CM | POA: Diagnosis not present

## 2022-11-29 DIAGNOSIS — L578 Other skin changes due to chronic exposure to nonionizing radiation: Secondary | ICD-10-CM | POA: Diagnosis not present

## 2022-12-15 IMAGING — DX DG HAND COMPLETE 3+V*R*
3 series · 3 of 3 positions shown · non-contrast
Comparison: None.

CLINICAL DATA: Right hand injury with laceration to the index
finger and middle finger.

EXAM:
RIGHT HAND - COMPLETE 3+ VIEW

[hand pa]
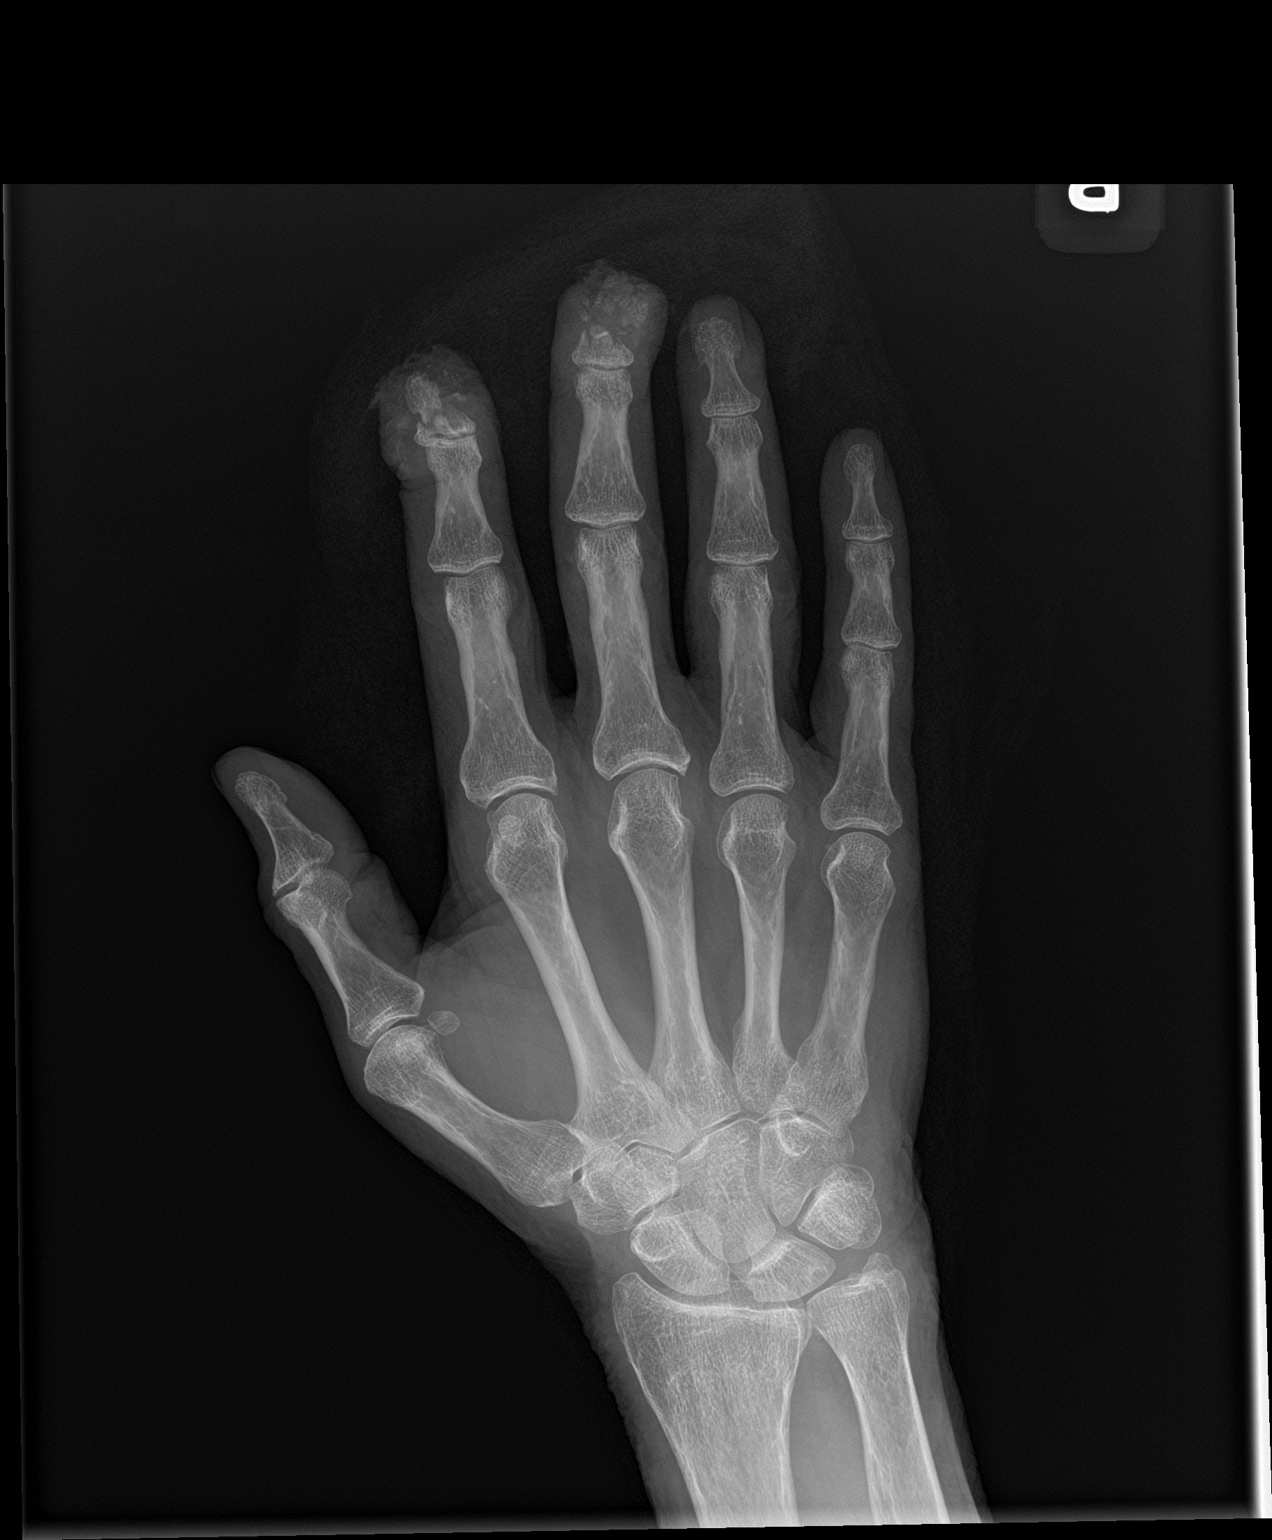

[hand obl]
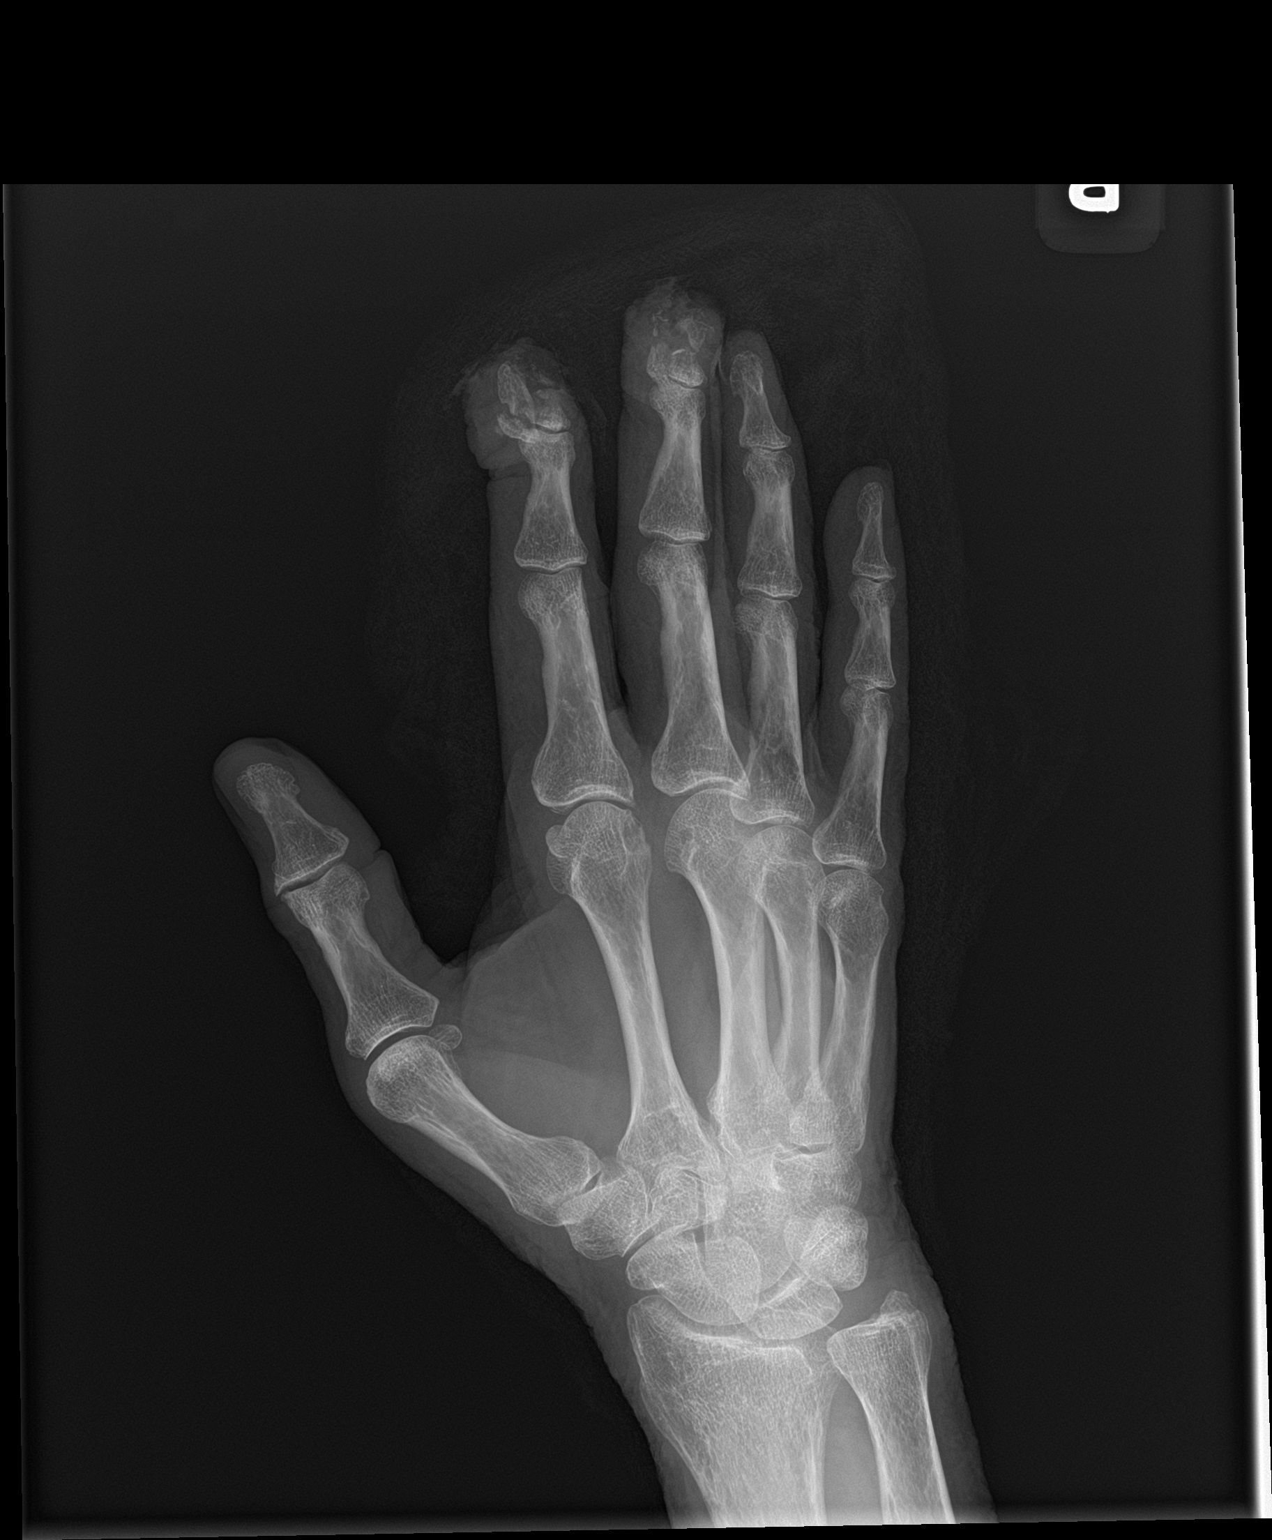

[hand lat]
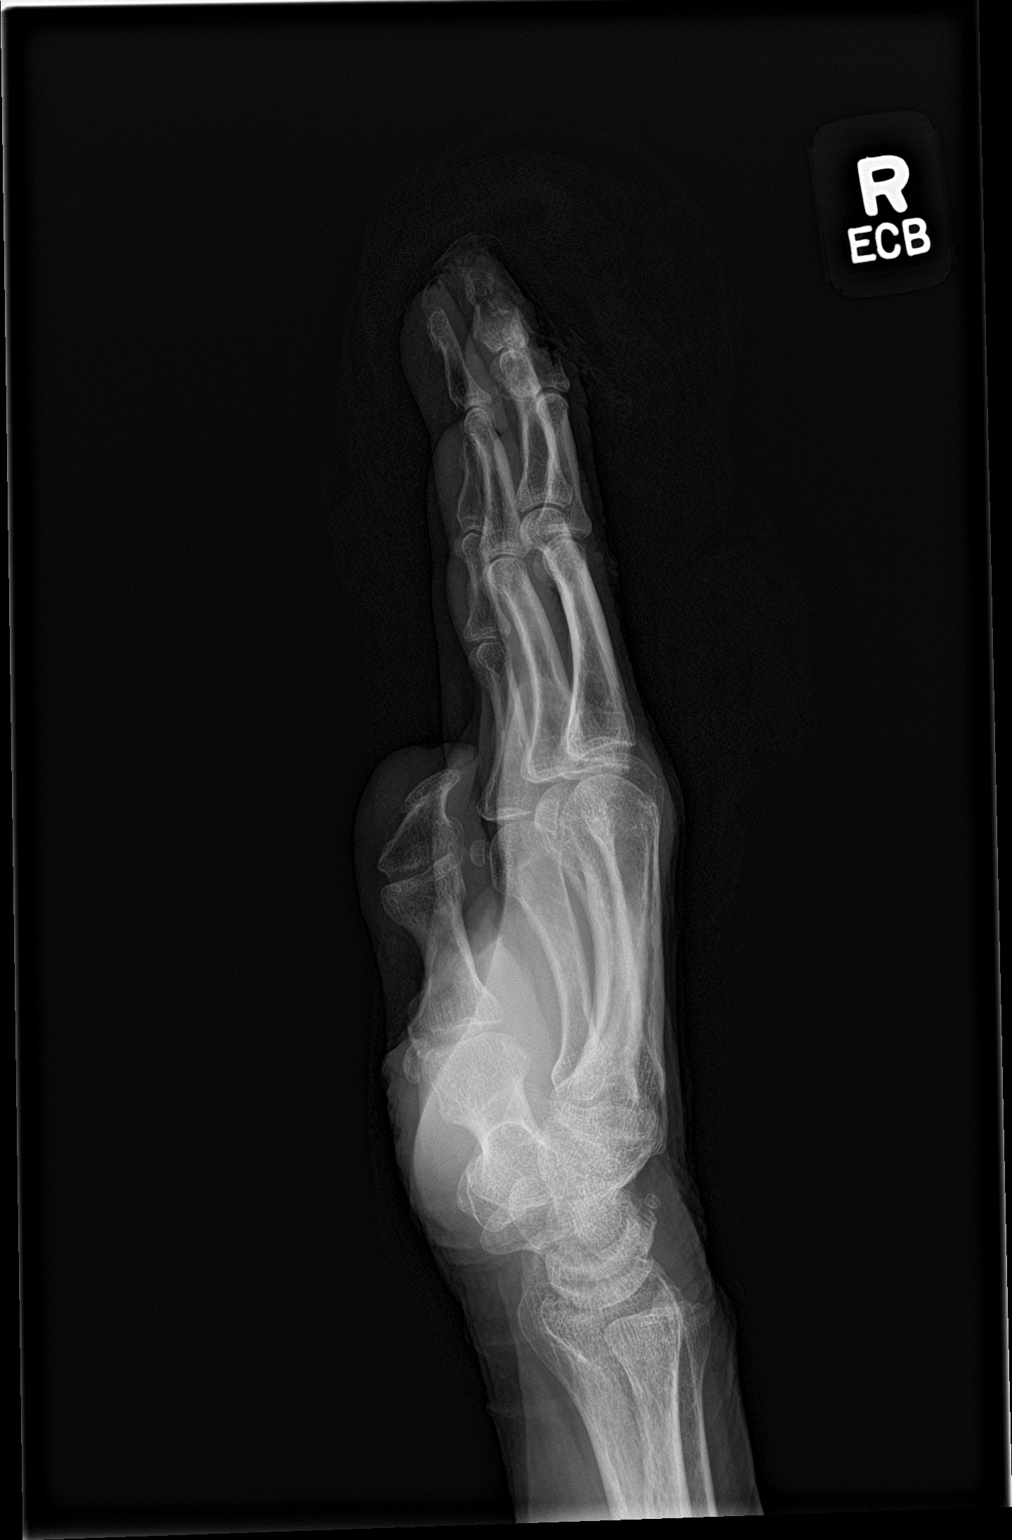

[3 of 3 positions shown; findings below may reference images not displayed]

FINDINGS: Comminuted displaced fractures of the second and third distal
phalanges with deformity of the soft tissues of the distal second
and third digits are noted. No other acute fracture or dislocation
is identified.
IMPRESSION: Comminuted displaced fractures of the second and third distal
phalanges with deformity of the soft tissues of the distal second
and third digits are noted.

## 2023-02-02 ENCOUNTER — Ambulatory Visit
Admission: RE | Admit: 2023-02-02 | Discharge: 2023-02-02 | Disposition: A | Discharge status: Home / Self Care | Payer: Medicare HMO | Source: Ambulatory Visit | Attending: Internal Medicine | Admitting: Internal Medicine

## 2023-02-02 ENCOUNTER — Other Ambulatory Visit: Payer: Self-pay | Admitting: Internal Medicine

## 2023-02-02 DIAGNOSIS — I700 Atherosclerosis of aorta: Secondary | ICD-10-CM | POA: Diagnosis not present

## 2023-02-02 DIAGNOSIS — M545 Low back pain, unspecified: Secondary | ICD-10-CM

## 2023-02-02 DIAGNOSIS — G8929 Other chronic pain: Secondary | ICD-10-CM | POA: Diagnosis not present

## 2023-02-16 ENCOUNTER — Other Ambulatory Visit: Payer: Self-pay | Admitting: Internal Medicine

## 2023-02-16 DIAGNOSIS — S32402A Unspecified fracture of left acetabulum, initial encounter for closed fracture: Secondary | ICD-10-CM

## 2023-02-21 DIAGNOSIS — M5451 Vertebrogenic low back pain: Secondary | ICD-10-CM | POA: Diagnosis not present

## 2023-03-14 DIAGNOSIS — L570 Actinic keratosis: Secondary | ICD-10-CM | POA: Diagnosis not present

## 2023-03-14 DIAGNOSIS — D485 Neoplasm of uncertain behavior of skin: Secondary | ICD-10-CM | POA: Diagnosis not present

## 2023-04-13 DIAGNOSIS — H43813 Vitreous degeneration, bilateral: Secondary | ICD-10-CM | POA: Diagnosis not present

## 2023-04-13 DIAGNOSIS — H524 Presbyopia: Secondary | ICD-10-CM | POA: Diagnosis not present

## 2023-08-19 ENCOUNTER — Ambulatory Visit
Admission: RE | Admit: 2023-08-19 | Discharge: 2023-08-19 | Disposition: A | Discharge status: Home / Self Care | Payer: No Typology Code available for payment source | Source: Ambulatory Visit | Attending: Internal Medicine | Admitting: Internal Medicine

## 2023-08-19 DIAGNOSIS — S32401A Unspecified fracture of right acetabulum, initial encounter for closed fracture: Secondary | ICD-10-CM
